# Patient Record
Sex: Male | Born: 1950 | Race: Black or African American | Hispanic: No | Smoking: Never smoker
Health system: Southern US, Community
[De-identification: ages and names within clinical notes are randomized; demographics above are authoritative.]

## PROBLEM LIST (undated history)

## (undated) DIAGNOSIS — C61 Malignant neoplasm of prostate: Secondary | ICD-10-CM

## (undated) DIAGNOSIS — Z803 Family history of malignant neoplasm of breast: Secondary | ICD-10-CM

## (undated) DIAGNOSIS — Z808 Family history of malignant neoplasm of other organs or systems: Secondary | ICD-10-CM

## (undated) DIAGNOSIS — Z8042 Family history of malignant neoplasm of prostate: Secondary | ICD-10-CM

## (undated) HISTORY — DX: Family history of malignant neoplasm of prostate: Z80.42

## (undated) HISTORY — DX: Malignant neoplasm of prostate: C61

## (undated) HISTORY — DX: Family history of malignant neoplasm of other organs or systems: Z80.8

## (undated) HISTORY — DX: Family history of malignant neoplasm of breast: Z80.3

## (undated) HISTORY — PX: HEMORROIDECTOMY: SUR656

## (undated) HISTORY — PX: ANAL FISSURE REPAIR: SHX2312

---

## 2019-07-03 ENCOUNTER — Inpatient Hospital Stay: Payer: Medicare HMO

## 2019-07-03 ENCOUNTER — Encounter (INDEPENDENT_AMBULATORY_CARE_PROVIDER_SITE_OTHER): Payer: Self-pay

## 2019-07-03 ENCOUNTER — Inpatient Hospital Stay: Payer: Medicare HMO | Attending: Oncology | Admitting: Oncology

## 2019-07-03 ENCOUNTER — Encounter: Payer: Self-pay | Admitting: Oncology

## 2019-07-03 ENCOUNTER — Other Ambulatory Visit: Payer: Self-pay

## 2019-07-03 VITALS — BP 145/89 | HR 90 | Temp 96.7°F | Resp 18 | Ht 74.61 in | Wt 167.8 lb

## 2019-07-03 DIAGNOSIS — E649 Sequelae of unspecified nutritional deficiency: Secondary | ICD-10-CM

## 2019-07-03 DIAGNOSIS — Z7952 Long term (current) use of systemic steroids: Secondary | ICD-10-CM | POA: Diagnosis not present

## 2019-07-03 DIAGNOSIS — M79606 Pain in leg, unspecified: Secondary | ICD-10-CM | POA: Insufficient documentation

## 2019-07-03 DIAGNOSIS — Z79818 Long term (current) use of other agents affecting estrogen receptors and estrogen levels: Secondary | ICD-10-CM | POA: Diagnosis not present

## 2019-07-03 DIAGNOSIS — C61 Malignant neoplasm of prostate: Secondary | ICD-10-CM

## 2019-07-03 DIAGNOSIS — F329 Major depressive disorder, single episode, unspecified: Secondary | ICD-10-CM | POA: Diagnosis not present

## 2019-07-03 DIAGNOSIS — E785 Hyperlipidemia, unspecified: Secondary | ICD-10-CM | POA: Diagnosis not present

## 2019-07-03 DIAGNOSIS — I2510 Atherosclerotic heart disease of native coronary artery without angina pectoris: Secondary | ICD-10-CM | POA: Diagnosis not present

## 2019-07-03 DIAGNOSIS — C7951 Secondary malignant neoplasm of bone: Secondary | ICD-10-CM | POA: Insufficient documentation

## 2019-07-03 DIAGNOSIS — G4730 Sleep apnea, unspecified: Secondary | ICD-10-CM | POA: Diagnosis not present

## 2019-07-03 DIAGNOSIS — R9721 Rising PSA following treatment for malignant neoplasm of prostate: Secondary | ICD-10-CM | POA: Insufficient documentation

## 2019-07-03 DIAGNOSIS — I129 Hypertensive chronic kidney disease with stage 1 through stage 4 chronic kidney disease, or unspecified chronic kidney disease: Secondary | ICD-10-CM | POA: Insufficient documentation

## 2019-07-03 DIAGNOSIS — Z452 Encounter for adjustment and management of vascular access device: Secondary | ICD-10-CM | POA: Insufficient documentation

## 2019-07-03 DIAGNOSIS — N183 Chronic kidney disease, stage 3 unspecified: Secondary | ICD-10-CM

## 2019-07-03 DIAGNOSIS — R740 Nonspecific elevation of levels of transaminase and lactic acid dehydrogenase [LDH]: Secondary | ICD-10-CM | POA: Diagnosis not present

## 2019-07-03 DIAGNOSIS — R3912 Poor urinary stream: Secondary | ICD-10-CM | POA: Diagnosis not present

## 2019-07-03 DIAGNOSIS — R748 Abnormal levels of other serum enzymes: Secondary | ICD-10-CM | POA: Diagnosis not present

## 2019-07-03 DIAGNOSIS — Z515 Encounter for palliative care: Secondary | ICD-10-CM | POA: Diagnosis not present

## 2019-07-03 DIAGNOSIS — Z9221 Personal history of antineoplastic chemotherapy: Secondary | ICD-10-CM | POA: Insufficient documentation

## 2019-07-03 DIAGNOSIS — Z79899 Other long term (current) drug therapy: Secondary | ICD-10-CM | POA: Diagnosis not present

## 2019-07-03 DIAGNOSIS — R5383 Other fatigue: Secondary | ICD-10-CM

## 2019-07-03 DIAGNOSIS — D649 Anemia, unspecified: Secondary | ICD-10-CM | POA: Diagnosis not present

## 2019-07-03 DIAGNOSIS — F32A Depression, unspecified: Secondary | ICD-10-CM

## 2019-07-03 DIAGNOSIS — M25512 Pain in left shoulder: Secondary | ICD-10-CM | POA: Insufficient documentation

## 2019-07-03 LAB — COMPREHENSIVE METABOLIC PANEL
ALT: 113 U/L — ABNORMAL HIGH (ref 0–44)
AST: 195 U/L — ABNORMAL HIGH (ref 15–41)
Albumin: 3.9 g/dL (ref 3.5–5.0)
Alkaline Phosphatase: 254 U/L — ABNORMAL HIGH (ref 38–126)
Anion gap: 8 (ref 5–15)
BUN: 37 mg/dL — ABNORMAL HIGH (ref 8–23)
CO2: 27 mmol/L (ref 22–32)
Calcium: 9.4 mg/dL (ref 8.9–10.3)
Chloride: 107 mmol/L (ref 98–111)
Creatinine, Ser: 1.38 mg/dL — ABNORMAL HIGH (ref 0.61–1.24)
GFR calc Af Amer: 60 mL/min — ABNORMAL LOW (ref >60)
GFR calc non Af Amer: 51 mL/min — ABNORMAL LOW (ref >60)
Glucose, Bld: 103 mg/dL — ABNORMAL HIGH (ref 70–99)
Potassium: 4.6 mmol/L (ref 3.5–5.1)
Sodium: 142 mmol/L (ref 135–145)
Total Bilirubin: 0.7 mg/dL (ref 0.3–1.2)
Total Protein: 6.9 g/dL (ref 6.5–8.1)

## 2019-07-03 LAB — CBC WITH DIFFERENTIAL/PLATELET
Abs Immature Granulocytes: 0.05 10*3/uL (ref 0.00–0.07)
Basophils Absolute: 0 10*3/uL (ref 0.0–0.1)
Basophils Relative: 0 %
Eosinophils Absolute: 0.1 10*3/uL (ref 0.0–0.5)
Eosinophils Relative: 1 %
HCT: 30.4 % — ABNORMAL LOW (ref 39.0–52.0)
Hemoglobin: 9.8 g/dL — ABNORMAL LOW (ref 13.0–17.0)
Immature Granulocytes: 1 %
Lymphocytes Relative: 32 %
Lymphs Abs: 2.3 10*3/uL (ref 0.7–4.0)
MCH: 30.9 pg (ref 26.0–34.0)
MCHC: 32.2 g/dL (ref 30.0–36.0)
MCV: 95.9 fL (ref 80.0–100.0)
Monocytes Absolute: 0.7 10*3/uL (ref 0.1–1.0)
Monocytes Relative: 9 %
Neutro Abs: 4 10*3/uL (ref 1.7–7.7)
Neutrophils Relative %: 57 %
Platelets: 251 10*3/uL (ref 150–400)
RBC: 3.17 MIL/uL — ABNORMAL LOW (ref 4.22–5.81)
RDW: 15 % (ref 11.5–15.5)
WBC: 7.1 10*3/uL (ref 4.0–10.5)
nRBC: 0.3 % — ABNORMAL HIGH (ref 0.0–0.2)

## 2019-07-03 LAB — PSA: Prostatic Specific Antigen: 937 ng/mL — ABNORMAL HIGH (ref 0.00–4.00)

## 2019-07-03 NOTE — Progress Notes (Signed)
Patient is recently moved from [place] and needs care with local oncologist.  He is unsure of the medication he is taking.

## 2019-07-03 NOTE — Progress Notes (Signed)
[place]  Reports that he was started on hormone blocker treatments-possibly Lupron and he did not tolerate that well.  Patient moved his care to [place] oncologist Patient moved to [facility] 3 weeks ago to live with his cousin.  His son helps with his moving. Patient reports having difficulty initiating a stream and urinary stream has been weak. His medication list shows that he is on Casodex 50 mg daily, Zytiga 250 mg 4 tablets by mouth dailyand prednisone.  Also on Flomax and doxazosin. Patient reports that he has not been taking any medication for the past few weeks.  Cannot recall details of the timeframe  He is in tears in the clinic.  Feels depressed.  No suicidal ideation. Denies any pain at this moment.  06/22/2019 Blood work doneWith primary care provider showed increased alkaline phosphatase 153, PSA at the level of 1004.   Review of Systems  Constitutional: Positive for fatigue. Negative for appetite change, chills, fever and unexpected weight change.  HENT:   Negative for hearing loss and voice change.   Eyes: Negative for eye problems and icterus.  Respiratory: Negative for chest tightness, cough and shortness of breath.   Cardiovascular: Negative for chest pain and leg swelling.  Gastrointestinal: Negative for abdominal distention and abdominal pain.  Endocrine: Negative for hot flashes.  Genitourinary: Negative for difficulty urinating, dysuria and frequency.        Weak urine stream  Musculoskeletal: Negative for arthralgias.  Skin: Negative for itching and rash.  Neurological: Negative for light-headedness and numbness.  Hematological: Negative for adenopathy. Does not bruise/bleed easily.  Psychiatric/Behavioral: Positive for depression. Negative for confusion. The patient is nervous/anxious.     MEDICAL HISTORY:  Past Medical History:  Diagnosis Date  . Prostate cancer [facility])     SURGICAL HISTORY: Past Surgical History:  Procedure Laterality Date   . ANAL FISSURE REPAIR    . HEMORROIDECTOMY      SOCIAL HISTORY: Social History   Socioeconomic History  . Marital status: Divorced    Spouse name: Not on file  . Number of children: Not on file  . Years of education: Not on file  . Highest education level: Not on file  Occupational History  . Not on file  Social Needs  . Financial resource strain: Not on file  . Food insecurity    Worry: Not on file    Inability: Not on file  . Transportation needs    Medical: Not on file    Non-medical: Not on file  Tobacco Use  . Smoking status: Never Smoker  . Smokeless tobacco: Never Used  Substance and Sexual Activity  . Alcohol use: Never    Frequency: Never  . Drug use: Never  . Sexual activity: Not Currently  Lifestyle  . Physical activity    Days per week: Not on file    Minutes per session: Not on file  . Stress: Not on file  Relationships  . Social Herbalist on phone: Not on file    Gets together: Not on file    Attends religious service: Not on file    Active member of club or organization: Not on file    Attends meetings of clubs or organizations: Not on file    Relationship status: Not on file  . Intimate partner violence    Fear of current or ex partner: Not on file    Emotionally abused: Not on file    Physically abused: Not on file  Forced sexual activity: Not on file  Other Topics Concern  . Not on file  Social History Narrative  . Not on file    FAMILY HISTORY: Family History  Problem Relation Age of Onset  . Skin cancer Father   . Breast cancer Sister   . Prostate cancer Paternal Grandfather     ALLERGIES:  is allergic to histamine.  MEDICATIONS:  Current Outpatient Medications  Medication Sig Dispense Refill  . doxazosin (CARDURA) 1 MG tablet Take 1 mg by mouth daily.    . tamsulosin (FLOMAX) 0.4 MG CAPS capsule Take 0.4 mg by mouth.     No current facility-administered medications for this visit.      PHYSICAL  EXAMINATION: ECOG PERFORMANCE STATUS: 1 - Symptomatic but completely ambulatory Vitals:   07/03/19 1113  BP: (!) 145/89  Pulse: 90  Resp: 18  Temp: (!) 96.7 F (35.9 C)   Filed Weights   07/03/19 1113  Weight: 167 lb 12.8 oz (76.1 kg)    Physical Exam Constitutional:      General: He is not in acute distress. HENT:     Head: Normocephalic and atraumatic.  Eyes:     General: No scleral icterus.    Pupils: Pupils are equal, round, and reactive to light.  Neck:     Musculoskeletal: Normal range of motion and neck supple.  Cardiovascular:     Rate and Rhythm: Normal rate and regular rhythm.     Heart sounds: Normal heart sounds.  Pulmonary:     Effort: Pulmonary effort is normal. No respiratory distress.     Breath sounds: No wheezing.  Abdominal:     General: Bowel sounds are normal. There is no distension.     Palpations: Abdomen is soft. There is no mass.     Tenderness: There is no abdominal tenderness.  Musculoskeletal: Normal range of motion.        General: No deformity.  Skin:    General: Skin is warm and dry.     Findings: No erythema or rash.  Neurological:     Mental Status: He is alert and oriented to person, place, and time.     Cranial Nerves: No cranial nerve deficit.     Coordination: Coordination normal.  Psychiatric:     Comments: Anxious and depressed.  Denies any suicidal ideation Tearful     LABORATORY DATA:  I have reviewed the data as listed Lab Results  Component Value Date   WBC 7.1 07/03/2019   HGB 9.8 (L) 07/03/2019   HCT 30.4 (L) 07/03/2019   MCV 95.9 07/03/2019   PLT 251 07/03/2019   Recent Labs    07/03/19 1202  NA 142  K 4.6  CL 107  CO2 27  GLUCOSE 103*  BUN 37*  CREATININE 1.38*  CALCIUM 9.4  GFRNONAA 51*  GFRAA 60*  PROT 6.9  ALBUMIN 3.9  AST 195*  ALT 113*  ALKPHOS 254*  BILITOT 0.7   Iron/TIBC/Ferritin/ %Sat No results found for: IRON, TIBC, FERRITIN, IRONPCTSAT    RADIOGRAPHIC STUDIES: I have  personally reviewed the radiological images as listed and agreed with the findings in the report. No results found.    ASSESSMENT & PLAN:  1. Prostate cancer ([place])   2. CKD (chronic kidney disease) stage 3, GFR 30-59 ml/min (HCC)   3. Normocytic anemia   4. Depression, unspecified depression type    Discussed with the patient that I would like to obtain his past oncology medical records. Check CBC,  CMP, PSA. He has not been on any of his medication recently. Further plan to be determined.  Likely need restaging of his pancreatic cancer.- CT and bone scan.  Patient mentioned that he has had CT scan done 2 months ago with his [place] oncologist.  Will obtain records and review. Labs reviewed. Normocytic anemia, hemoglobin 9.8, MCV 95.9. Likely chronic kidney disease with creatinine of 1.38, estimated EGFR 60.  Stage III PSA is elevated at 937  I discussed with patient about resuming Flomax for his urinary symptoms which most likely secondary to enlargement of prostate.  Patient was reluctant because of previously side effects when he was taking both Flomax and Cardura. He is also not interested to resume androgen deprivation therapy. Is tearful during current encounter.  Possible depression.  He denies any suicidal ideation. Not able to discuss further because of his mood.  Offered to call his family members for further discussion and patient declined. We will have him follow-up with [place] after we obtained medical records from his [place] oncologist. #Transaminitis, etiology unknown.  #I called patient on 07/04/2019 and had a phone discussion with him. He reports his mood is better now.   Lab results are reviewed and discussed with him. He has extremely elevated PSA, most likely secondary to being off medication and androgen deprivation therapy. I would obtain testosterone level, hepatitis panel, and also discussed about resuming androgen deprivation therapy with [facility] Koller.  Side effects  including vasomotor symptoms, i.e. hot flashes discussed with patient.  We also discussed about Effexor potentially being effective in treating vasomotor symptoms.  He voices understanding and is willing to proceed.  Orders Placed This Encounter  Procedures  . CBC with Differential/Platelet    Standing Status:   Future    Number of Occurrences:   1    Standing Expiration Date:   07/02/2020  . Comprehensive metabolic panel    Standing Status:   Future    Number of Occurrences:   1    Standing Expiration Date:   07/02/2020  . PSA    Standing Status:   Future    Number of Occurrences:   1    Standing Expiration Date:   07/02/2020    All questions were answered. The patient knows to call the clinic with any problems questions or concerns.  cc Latanya Presser B, MD   Return of visit: to be determined Thank you for this kind referral and the opportunity to participate in the care of this patient. A copy of today's note is routed to referring provider  Total face to face encounter time for this patient visit was 60 min. >50% of the time was  spent in counseling and coordination of care.    Earlie Server, MD, PhD Hematology Oncology [facility] at [facility] Pager- [phone] 07/03/2019

## 2019-07-04 ENCOUNTER — Encounter: Payer: Self-pay | Admitting: Oncology

## 2019-07-04 DIAGNOSIS — C61 Malignant neoplasm of prostate: Secondary | ICD-10-CM | POA: Insufficient documentation

## 2019-07-06 ENCOUNTER — Other Ambulatory Visit: Payer: Self-pay

## 2019-07-06 ENCOUNTER — Inpatient Hospital Stay: Payer: Medicare HMO

## 2019-07-06 DIAGNOSIS — C61 Malignant neoplasm of prostate: Secondary | ICD-10-CM | POA: Diagnosis not present

## 2019-07-06 DIAGNOSIS — D649 Anemia, unspecified: Secondary | ICD-10-CM

## 2019-07-06 DIAGNOSIS — F32A Depression, unspecified: Secondary | ICD-10-CM

## 2019-07-06 DIAGNOSIS — F329 Major depressive disorder, single episode, unspecified: Secondary | ICD-10-CM

## 2019-07-06 DIAGNOSIS — N183 Chronic kidney disease, stage 3 unspecified: Secondary | ICD-10-CM

## 2019-07-06 LAB — RETIC PANEL
Immature Retic Fract: 25.7 % — ABNORMAL HIGH (ref 2.3–15.9)
RBC.: 2.9 MIL/uL — ABNORMAL LOW (ref 4.22–5.81)
Retic Count, Absolute: 100.6 10*3/uL (ref 19.0–186.0)
Retic Ct Pct: 3.5 % — ABNORMAL HIGH (ref 0.4–3.1)
Reticulocyte Hemoglobin: 30.7 pg (ref >27.9)

## 2019-07-06 LAB — FERRITIN: Ferritin: 880 ng/mL — ABNORMAL HIGH (ref 24–336)

## 2019-07-06 LAB — IRON AND TIBC
Iron: 53 ug/dL (ref 45–182)
Saturation Ratios: 20 % (ref 17.9–39.5)
TIBC: 267 ug/dL (ref 250–450)
UIBC: 214 ug/dL

## 2019-07-06 LAB — FOLATE: Folate: 17.8 ng/mL (ref >5.9)

## 2019-07-06 LAB — VITAMIN B12: Vitamin B-12: 1240 pg/mL — ABNORMAL HIGH (ref 180–914)

## 2019-07-06 MED ORDER — DEGARELIX ACETATE 80 MG ~~LOC~~ SOLR: 80.0000 mg | Freq: Once | SUBCUTANEOUS | Status: DC

## 2019-07-06 MED ORDER — DEGARELIX ACETATE(240 MG DOSE) 120 MG/VIAL ~~LOC~~ SOLR
240.0000 mg | Freq: Once | SUBCUTANEOUS | Status: AC
  Administered 2019-07-06: 240 mg via SUBCUTANEOUS
  Filled 2019-07-06: qty 6

## 2019-07-07 LAB — HEPATITIS PANEL, ACUTE
HCV Ab: <0.1 s/co ratio (ref 0.0–0.9)
Hep A IgM: NEGATIVE
Hep B C IgM: NEGATIVE
Hepatitis B Surface Ag: NEGATIVE

## 2019-07-07 LAB — TESTOSTERONE: Testosterone: <3 ng/dL — ABNORMAL LOW (ref 264–916)

## 2019-07-12 ENCOUNTER — Inpatient Hospital Stay (HOSPITAL_BASED_OUTPATIENT_CLINIC_OR_DEPARTMENT_OTHER): Payer: Medicare HMO | Admitting: Hospice and Palliative Medicine

## 2019-07-12 ENCOUNTER — Other Ambulatory Visit: Payer: Self-pay

## 2019-07-12 VITALS — BP 146/102 | HR 79 | Temp 97.3°F | Resp 16 | Wt 171.9 lb

## 2019-07-12 DIAGNOSIS — F329 Major depressive disorder, single episode, unspecified: Secondary | ICD-10-CM | POA: Diagnosis not present

## 2019-07-12 DIAGNOSIS — C61 Malignant neoplasm of prostate: Secondary | ICD-10-CM

## 2019-07-12 DIAGNOSIS — Z7189 Other specified counseling: Secondary | ICD-10-CM

## 2019-07-12 DIAGNOSIS — Z515 Encounter for palliative care: Secondary | ICD-10-CM | POA: Diagnosis not present

## 2019-07-12 DIAGNOSIS — F32A Depression, unspecified: Secondary | ICD-10-CM

## 2019-07-12 MED ORDER — ESCITALOPRAM OXALATE 10 MG PO TABS: 10.0000 mg | ORAL_TABLET | Freq: Every day | ORAL | 0 refills | Status: DC

## 2019-07-12 NOTE — Progress Notes (Signed)
[facility]  Telephone:([phone] [phone]   Name: Tyler Stevenson Date: 07/12/2019 MRN: [id number]  DOB: 1951/04/07  Patient Care Team: Audley Hose, MD as PCP - General (Internal Medicine)    REASON FOR CONSULTATION: Palliative Care consult requested for this 68 y.o. 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  PMH also notable for sleep apnea, CAD, hypertension, hyperlipidemia and CKD stage III.  Patient moved here from [place] and presented to the cancer center to establish care. Patient was referred to palliative care to help discuss goals and manage ongoing symptoms.   SOCIAL HISTORY:     reports that he has never smoked. He has never used smokeless tobacco. He reports that he does not drink alcohol or use drugs.  Patient is divorced.  He is originally from [place] but lived in [place] and [place] and moved to [place] in 2018.  Patient moved to [facility] and August 2020.  Patient has a son in [place].  Patient also has another son and daughter in [place].  Patient is currently living with a cousin.  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  ADVANCE DIRECTIVES:  Does not have  CODE STATUS: DNR (MOST Form completed 07/12/2019)  PAST MEDICAL HISTORY: Past Medical History:  Diagnosis Date  . Prostate cancer ([place])     PAST SURGICAL HISTORY:  Past Surgical History:  Procedure Laterality Date  . ANAL FISSURE REPAIR    . HEMORROIDECTOMY      HEMATOLOGY/ONCOLOGY HISTORY:  Oncology History   No history exists.    ALLERGIES:  is allergic to histamine.  MEDICATIONS:  Current Outpatient Medications  Medication Sig Dispense Refill  . doxazosin (CARDURA) 1 MG tablet Take 1 mg by mouth daily.    . tamsulosin (FLOMAX) 0.4 MG CAPS  capsule Take 0.4 mg by mouth.     No current facility-administered medications for this visit.     VITAL SIGNS: There were no vitals taken for this visit. There were no vitals filed for this visit.  Estimated body mass index is 21.2 kg/m as calculated from the following:   Height as of 07/03/19: 6' 2.61" (1.895 m).   Weight as of 07/03/19: 167 lb 12.8 oz (76.1 kg).  LABS: CBC:    Component Value Date/Time   WBC 7.1 07/03/2019 1202   HGB 9.8 (L) 07/03/2019 1202   HCT 30.4 (L) 07/03/2019 1202   PLT 251 07/03/2019 1202   MCV 95.9 07/03/2019 1202   NEUTROABS 4.0 07/03/2019 1202   LYMPHSABS 2.3 07/03/2019 1202   MONOABS 0.7 07/03/2019 1202   EOSABS 0.1 07/03/2019 1202   BASOSABS 0.0 07/03/2019 1202   Comprehensive Metabolic Panel:    Component Value Date/Time   NA 142 07/03/2019 1202   K 4.6 07/03/2019 1202   CL 107 07/03/2019 1202   CO2 27 07/03/2019 1202   BUN 37 (H) 07/03/2019 1202   CREATININE 1.38 (H) 07/03/2019 1202   GLUCOSE 103 (H) 07/03/2019 1202   CALCIUM 9.4 07/03/2019 1202   AST 195 (H) 07/03/2019 1202   ALT 113 (H) 07/03/2019 1202   ALKPHOS 254 (H) 07/03/2019 1202   BILITOT 0.7 07/03/2019 1202   PROT 6.9 07/03/2019 1202   ALBUMIN 3.9 07/03/2019 1202    RADIOGRAPHIC STUDIES: No results found.  PERFORMANCE STATUS (ECOG) : 2 - Symptomatic, <  50% confined to bed  Review of Systems Unless otherwise noted, a complete review of systems is negative.  Physical Exam General: NAD, frail appearing, thin Cardiovascular: regular rate and rhythm Pulmonary: clear ant fields Abdomen: soft, nontender, + bowel sounds GU: no suprapubic tenderness Extremities: 1+ bilateral lower extremity edema, no joint deformities Skin: no rashes Neurological: Weakness but otherwise nonfocal  IMPRESSION: I met with patient today in the clinic.  Introduced palliative care services and attempted to establish therapeutic rapport.  Patient is not a great historian but I gather that he  was diagnosed many years back with prostate cancer in [place] and then transferred care to [place] in 2018.  Patient previously received radiation seeding and then had chemotherapy.  He was on hormonal therapy but describes a situation about 6 months ago where he received "a double dose" of something that landed him in the hospital.  He says that things have not been the same since that event.  Patient was living in [place] alone after his wife left him and they divorced.  He says that a friend was cooking for him and helping with his care.  She grew concerned that he was having increased confusion and called his son, which led to family bringing him to [place].  Patient is now living with a first cousin but says he is independent with his own care.  Patient feels like he is generally doing better.  We discussed his general goals.  Patient says that he knows he has stage IV cancer and has been told that it is not curable but does not believe that.  He feels that through a combination of strong faith and holistic nutrition, that a cure might be possible.  Patient does seem receptive to the idea of resuming treatment for his cancer if any options are available.  Patient was emotionally labile during the visit.  He became very tearful as he described feeling as if he has been betrayed by his ex-wife and family.  He describes feeling "emotional pain."  We discussed options for initiation of an antidepressant and patient was in agreement.  He was also interested in referral to counseling.  He adamantly denies SI/HI.  Patient does not have advance care directives.  He would be interested in establishing a healthcare power of attorney and living will and I reviewed those documents with him today.  He thinks he would want his cousin to be his decision maker as he lives locally.  We discussed CODE STATUS.  Patient was quick to tell me that he would not want his life prolonged artificially on machines.   He would not want to be resuscitated.  He was in agreement with DNR.   I completed a MOST form today. The patient outlined their wishes for the following treatment decisions:  Cardiopulmonary Resuscitation: Do Not Attempt Resuscitation (DNR/No CPR)  Medical Interventions: Limited Additional Interventions: Use medical treatment, IV fluids and cardiac monitoring as indicated, DO NOT USE intubation or mechanical ventilation. May consider use of less invasive airway support such as BiPAP or CPAP. Also provide comfort measures. Transfer to the hospital if indicated. Avoid intensive care.   Antibiotics: Antibiotics if indicated  IV Fluids: IV fluids if indicated  Feeding Tube: No feeding tube   Case and plan discussed with Dr. Tasia Catchings.  PLAN: -Patient to follow-up with Dr. Tasia Catchings regarding further work-up and treatment options -Would recommend CT of the head due to report of cognitive changes and emotional lability -We will start Lexapro  10 mg daily -MOST Form completed -DNR -ACP documents reviewed and sent home with patient -RTC in 2 weeks   Patient expressed understanding and was in agreement with this plan. He also understands that He can call the clinic at any time with any questions, concerns, or complaints.     Time Total: 60 minutes  Visit consisted of counseling and education dealing with the complex and emotionally intense issues of symptom management and palliative care in the setting of serious and potentially life-threatening illness.Greater than 50%  of this time was spent counseling and coordinating care related to the above assessment and plan.  Signed by: Altha Harm, PhD, NP-C [phone] (Work Cell)

## 2019-07-16 ENCOUNTER — Telehealth: Payer: Self-pay | Admitting: Pharmacy Technician

## 2019-07-16 ENCOUNTER — Encounter: Payer: Self-pay | Admitting: Oncology

## 2019-07-16 ENCOUNTER — Telehealth: Payer: Self-pay | Admitting: Pharmacist

## 2019-07-16 ENCOUNTER — Inpatient Hospital Stay: Payer: Medicare HMO | Admitting: Oncology

## 2019-07-16 ENCOUNTER — Other Ambulatory Visit: Payer: Self-pay

## 2019-07-16 ENCOUNTER — Inpatient Hospital Stay: Payer: Medicare HMO

## 2019-07-16 VITALS — BP 149/91 | HR 71 | Temp 98.2°F | Resp 18 | Wt 168.8 lb

## 2019-07-16 DIAGNOSIS — D649 Anemia, unspecified: Secondary | ICD-10-CM | POA: Diagnosis not present

## 2019-07-16 DIAGNOSIS — N183 Chronic kidney disease, stage 3 unspecified: Secondary | ICD-10-CM

## 2019-07-16 DIAGNOSIS — F32A Depression, unspecified: Secondary | ICD-10-CM

## 2019-07-16 DIAGNOSIS — C61 Malignant neoplasm of prostate: Secondary | ICD-10-CM | POA: Diagnosis not present

## 2019-07-16 DIAGNOSIS — Z95828 Presence of other vascular implants and grafts: Secondary | ICD-10-CM

## 2019-07-16 DIAGNOSIS — F329 Major depressive disorder, single episode, unspecified: Secondary | ICD-10-CM | POA: Diagnosis not present

## 2019-07-16 DIAGNOSIS — C7951 Secondary malignant neoplasm of bone: Secondary | ICD-10-CM

## 2019-07-16 DIAGNOSIS — N1831 Chronic kidney disease, stage 3a: Secondary | ICD-10-CM | POA: Insufficient documentation

## 2019-07-16 DIAGNOSIS — Z7189 Other specified counseling: Secondary | ICD-10-CM

## 2019-07-16 MED ORDER — ZYTIGA 500 MG PO TABS: 1000.0000 mg | ORAL_TABLET | Freq: Every day | ORAL | 0 refills | Status: DC

## 2019-07-16 MED ORDER — ACETAMINOPHEN-CODEINE #3 300-30 MG PO TABS: 1.0000 | ORAL_TABLET | Freq: Three times a day (TID) | ORAL | 0 refills | Status: DC | PRN

## 2019-07-16 MED ORDER — ABIRATERONE ACETATE 250 MG PO TABS: 750.0000 mg | ORAL_TABLET | Freq: Every day | ORAL | 0 refills | Status: DC

## 2019-07-16 MED ORDER — PREDNISONE 5 MG PO TABS: 5.0000 mg | ORAL_TABLET | Freq: Every day | ORAL | 0 refills | Status: DC

## 2019-07-16 MED ORDER — SODIUM CHLORIDE 0.9% FLUSH
10.0000 mL | Freq: Once | INTRAVENOUS | Status: AC
  Administered 2019-07-16: 10 mL via INTRAVENOUS
  Filled 2019-07-16: qty 10

## 2019-07-16 MED ORDER — HEPARIN SOD (PORK) LOCK FLUSH 100 UNIT/ML IV SOLN
500.0000 [IU] | Freq: Once | INTRAVENOUS | Status: AC
  Administered 2019-07-16: 10:00:00 500 [IU] via INTRAVENOUS

## 2019-07-16 NOTE — Telephone Encounter (Addendum)
Oral Oncology Patient Advocate Encounter  After completing a benefits investigation, prior authorization for Abiraterone (generic Zytiga) is not required at this time through [place].  Patient's copay is $0.00.   [facility] Phone [phone] Fax [phone] 07/16/2019 11:04 AM

## 2019-07-16 NOTE — Progress Notes (Signed)
Patient reports having pain in left leg, left arm, right side chest area pain last night.

## 2019-07-16 NOTE — Progress Notes (Signed)
Hematology Oncology Progress Note.   REASON FOR VISIT Follow up for treatment of metastatic prostate cancer.   PERTINENT ONCOLOGY HISTORY Tyler Stevenson is a 68 y.o.[place] who has above oncology history reviewed by me today presented for follow up visit for management of prostate cancer  Patient reports a history of prostate cancer. Initially diagnosed in [place]. He was treated at [facility].  Reports that he was started on hormone blocker treatments-possibly Lupron and he did not tolerate that well.  Patient moved his care to [place] oncologist Patient moved to [facility] 3 weeks ago to live with his cousin.  Patient reports having difficulty initiating a stream and urinary stream has been weak. His medication list shows that he is on Casodex 50 mg daily, Zytiga 250 mg 4 tablets by mouth dailyand prednisone.  Also on Flomax and doxazosin. Patient reports that he has not been taking any medication for the past few weeks.  Cannot recall details of the timeframe  He is in tears in the clinic.  Feels depressed.  No suicidal ideation. Denies any pain at this moment.  06/22/2019 Blood work doneWith primary care provider showed increased alkaline phosphatase 153, PSA at the level of 1004.   INTERVAL HISTORY Tyler Stevenson is a 68 y.o. male who has above history reviewed by me today presents for follow up visit for management of metastatic prostate cancer Problems and complaints are listed below: I obtained patient's medical records from [place]. Extensive medical record review was performed by me. Per [place] oncologist notes, patient was started on Zytiga 1000 mg daily along with prednisone 5 mg twice daily in March 2020.  02/27/2019 PSA was 150. 02/2019, PSA rises to 324 and decision was made to start Taxotere treatments. Port was placed in June 2020. It was not clear how many cycles of Taxotere that patient received. 04/24/2019 patient was responding to  Taxotere with significant drop of the PSA to 128 patient experienced effects from Taxotere including fatigue, mild constipation.  05/17/2019 patient missed his chemo on 05/03/2019 since he was in [place]. 05/30/2019 he probably get another Taxotere at that time.  Per note patient had MRI brain 05/23/2019 which showed no acute intracranial process.  Mild generalized volume loss.  Mild to moderate sequela of chronic small vessel ischemic disease.  No evidence of intracranial metastatic disease.  Today patient reports that lower extremity pain, left shoulder pain which he attributes to pulling a muscle.  Denies any back pain Patient has establish care with palliative service.  Started on Lexapro 10 mg daily.  Reports tolerating well. He takes ibuprofen for his pain.  Review of Systems  Constitutional: Positive for fatigue. Negative for appetite change, chills, fever and unexpected weight change.  HENT:   Negative for hearing loss and voice change.   Eyes: Negative for eye problems and icterus.  Respiratory: Negative for chest tightness, cough and shortness of breath.   Cardiovascular: Negative for chest pain and leg swelling.  Gastrointestinal: Negative for abdominal distention and abdominal pain.  Endocrine: Negative for hot flashes.  Genitourinary: Negative for difficulty urinating, dysuria and frequency.        Weak urine stream  Musculoskeletal: Negative for arthralgias.       Leg and arm pain.  Skin: Negative for itching and rash.  Neurological: Negative for light-headedness and numbness.  Hematological: Negative for adenopathy. Does not bruise/bleed easily.  Psychiatric/Behavioral: Positive for depression. Negative for confusion. The patient is nervous/anxious.     MEDICAL  HISTORY:  Past Medical History:  Diagnosis Date  . Prostate cancer [facility])     SURGICAL HISTORY: Past Surgical History:  Procedure Laterality Date  . ANAL FISSURE REPAIR    . HEMORROIDECTOMY      SOCIAL  HISTORY: Social History   Socioeconomic History  . Marital status: Divorced    Spouse name: Not on file  . Number of children: Not on file  . Years of education: Not on file  . Highest education level: Not on file  Occupational History  . Not on file  Social Needs  . Financial resource strain: Not on file  . Food insecurity    Worry: Not on file    Inability: Not on file  . Transportation needs    Medical: Not on file    Non-medical: Not on file  Tobacco Use  . Smoking status: Never Smoker  . Smokeless tobacco: Never Used  Substance and Sexual Activity  . Alcohol use: Never    Frequency: Never  . Drug use: Never  . Sexual activity: Not Currently  Lifestyle  . Physical activity    Days per week: Not on file    Minutes per session: Not on file  . Stress: Not on file  Relationships  . Social Herbalist on phone: Not on file    Gets together: Not on file    Attends religious service: Not on file    Active member of club or organization: Not on file    Attends meetings of clubs or organizations: Not on file    Relationship status: Not on file  . Intimate partner violence    Fear of current or ex partner: Not on file    Emotionally abused: Not on file    Physically abused: Not on file    Forced sexual activity: Not on file  Other Topics Concern  . Not on file  Social History Narrative  . Not on file    FAMILY HISTORY: Family History  Problem Relation Age of Onset  . Skin cancer Father   . Breast cancer Sister   . Prostate cancer Paternal Grandfather     ALLERGIES:  is allergic to histamine.  MEDICATIONS:  Current Outpatient Medications  Medication Sig Dispense Refill  . escitalopram (LEXAPRO) 10 MG tablet Take 1 tablet (10 mg total) by mouth daily. 30 tablet 0  . ibuprofen (ADVIL) 600 MG tablet Take 600 mg by mouth 2 (two) times daily as needed.    . Magnesium 300 MG CAPS Take by mouth 2 (two) times daily.    Marland Kitchen abiraterone acetate (ZYTIGA) 500 MG  tablet Take 2 tablets (1,000 mg total) by mouth daily. Take on an empty stomach 1 hour before or 2 hours after a meal. 30 tablet 0  . doxazosin (CARDURA) 1 MG tablet Take 1 mg by mouth daily.    . predniSONE (DELTASONE) 5 MG tablet Take 1 tablet (5 mg total) by mouth daily with breakfast. 60 tablet 0  . tamsulosin (FLOMAX) 0.4 MG CAPS capsule Take 0.4 mg by mouth.     No current facility-administered medications for this visit.      PHYSICAL EXAMINATION: ECOG PERFORMANCE STATUS: 1 - Symptomatic but completely ambulatory Vitals:   07/16/19 0848  BP: (!) 149/91  Pulse: 71  Resp: 18  Temp: 98.2 F (36.8 C)   Filed Weights   07/16/19 0848  Weight: 168 lb 12.8 oz (76.6 kg)    Physical Exam Constitutional:  General: He is not in acute distress. HENT:     Head: Normocephalic and atraumatic.  Eyes:     General: No scleral icterus.    Pupils: Pupils are equal, round, and reactive to light.  Neck:     Musculoskeletal: Normal range of motion and neck supple.  Cardiovascular:     Rate and Rhythm: Normal rate and regular rhythm.     Heart sounds: Normal heart sounds.  Pulmonary:     Effort: Pulmonary effort is normal. No respiratory distress.     Breath sounds: No wheezing.  Abdominal:     General: Bowel sounds are normal. There is no distension.     Palpations: Abdomen is soft. There is no mass.     Tenderness: There is no abdominal tenderness.  Musculoskeletal: Normal range of motion.        General: No deformity.  Skin:    General: Skin is warm and dry.     Findings: No erythema or rash.  Neurological:     Mental Status: He is alert and oriented to person, place, and time.     Cranial Nerves: No cranial nerve deficit.     Coordination: Coordination normal.  Psychiatric:     Comments: Anxious and depressed.  Denies any suicidal ideation Tearful     LABORATORY DATA:  I have reviewed the data as listed Lab Results  Component Value Date   WBC 7.1 07/03/2019   HGB  9.8 (L) 07/03/2019   HCT 30.4 (L) 07/03/2019   MCV 95.9 07/03/2019   PLT 251 07/03/2019   Recent Labs    07/03/19 1202  NA 142  K 4.6  CL 107  CO2 27  GLUCOSE 103*  BUN 37*  CREATININE 1.38*  CALCIUM 9.4  GFRNONAA 51*  GFRAA 60*  PROT 6.9  ALBUMIN 3.9  AST 195*  ALT 113*  ALKPHOS 254*  BILITOT 0.7   Iron/TIBC/Ferritin/ %Sat    Component Value Date/Time   IRON 53 07/06/2019 0958   TIBC 267 07/06/2019 0958   FERRITIN 880 (H) 07/06/2019 0958   IRONPCTSAT 20 07/06/2019 0958      RADIOGRAPHIC STUDIES: I have personally reviewed the radiological images as listed and agreed with the findings in the report. No results found.    ASSESSMENT & PLAN:  1. Prostate cancer metastatic to bone ([place])   2. Depression, unspecified depression type   3. CKD (chronic kidney disease) stage 3, GFR 30-59 ml/min (HCC)   4. Normocytic anemia   5. Goals of care, counseling/discussion    #Labs are discussed with patient.  Patient has extremely high PSA level, with testosterone level less than 3.  Consistent with castration resistant prostate cancer.  Recommend patient to proceed with CT chest abdomen pelvis without contrast, bone scan for further evaluation of the extent of the cancer. Meanwhile patient has been restarted on ADT with Firmagon injections. I discussed with patient about restarting Zytiga 1000 mg daily prednisone 5 mg twice daily.  He agrees. We will ask oral chemotherapy pharmacist to look into his coverage is.  Prednisone prescription was given to patient.  Neoplasm related pain, patient is currently taking ibuprofen for pain.  He has CKD with a creatinine of 1.38. Recommend patient to avoid NSAIDs which is a nephrotoxins. Recommend patient to start with Tylenol #3, will switch to stronger narcotics if Tylenol 3 is not to control his pain. Patient agrees with above  Goal of care, discussed with patient that he has stage IV prostate cancer  and it is not curable.  I am  still waiting for the clarification from his [place] oncologist office to regarding how many doses of Taxotere that he got. He may need to be restarted on Taxotere if PSA is resistant to Zytiga and prednisone. Patient has a Mediport placed in June 2020.  We will schedule patient to have a port flush for the time being.  #Transaminitis, etiology unknown.  Pending above work-up.   Orders Placed This Encounter  Procedures  . NM Bone Scan Whole Body    Standing Status:   Future    Standing Expiration Date:   07/15/2020    Order Specific Question:   If indicated for the ordered procedure, I authorize the administration of a radiopharmaceutical per Radiology protocol    Answer:   Yes    Order Specific Question:   Preferred imaging location?    Answer:   [facility]    Order Specific Question:   Radiology Contrast Protocol - do NOT remove file path    Answer:   \\[email or web address]\[email or web address]  . CT Chest Wo Contrast    Standing Status:   Future    Standing Expiration Date:   07/15/2020    Order Specific Question:   Preferred imaging location?    Answer:   [facility]    Order Specific Question:   Radiology Contrast Protocol - do NOT remove file path    Answer:   \\[email or web address]\Radiant\CTProtocols.pdf  . CT Abdomen Pelvis Wo Contrast    Standing Status:   Future    Standing Expiration Date:   07/15/2020    Order Specific Question:   Preferred imaging location?    Answer:   [facility]    Order Specific Question:   Is Oral Contrast requested for this exam?    Answer:   Yes, Per Radiology protocol    Order Specific Question:   Radiology Contrast Protocol - do NOT remove file path    Answer:   \\[email or web address]\Radiant\CTProtocols.pdf    All questions were answered. The patient knows to call the clinic with any problems questions or concerns.  cc Latanya Presser B, MD   Return of visit: 2 weeks Thank you for this kind referral and the opportunity to  participate in the care of this patient. A copy of today's note is routed to referring provider  Total face to face encounter time for this patient visit was 40 min. >50% of the time was  spent in counseling and coordination of care.    Earlie Server, MD, PhD Hematology Oncology [facility] at [facility] Pager- [phone] 07/16/2019

## 2019-07-16 NOTE — Telephone Encounter (Signed)
Oral Oncology Pharmacist Encounter  Received new prescription for Zytiga (abiraterone) for the treatment of metastatic castration resistant prostate cancer in conjunction with prednisone and firmagon, planned duration until disease progression or unacceptable drug toxicity. He has been on abiraterone in the past prescribed by another provider. The plan is to get him reestablished on abiraterone.  CMP from 07/03/2019 assessed, elevation in AST/ALT likely due to disease burden. Patient had labs on 05/25/2019 (scanned in Media) that has AST/ALT within normal limits when he was being treated. Spoke with Dr. Tasia Catchings, patient is having imaging repeated to confirm liver labs are due to disease. For now Dr. Tasia Catchings would like for Tyler Stevenson to start on a reduced dose. Prescription dose and frequency assessed.   Current medication list in Epic reviewed, one relevant DDIs with abiraterone identified: -Tamsulosin: Abiraterone may increase the serum concentration of tamsulosin. Monitor for increased tamsulosin effects. No baseline dose adjustment needed.    Prescription has been e-scribed to the [facility] for benefits analysis and approval.  Oral Oncology Clinic will continue to follow for insurance authorization, copayment issues, initial counseling and start date.  Darl Pikes, PharmD, BCPS, [facility] Hematology/Oncology Clinical Pharmacist [facility] [phone]  07/16/2019 10:53 AM

## 2019-07-18 MED FILL — ABIRATERONE ACETATE 250 MG: 250 | 30 days supply | Qty: 90 | Fill #0

## 2019-07-18 NOTE — Telephone Encounter (Signed)
Oral Chemotherapy Pharmacist Encounter  Patient Education I spoke with patient for overview of new oral chemotherapy medication: Zytiga (abiraterone) for the treatment of metastatic castration resistant prostate cancer in conjunction with prednisone and firmagon, planned duration until disease progression or unacceptable drug toxicity. He has been on abiraterone in the past prescribed by another provider. The plan is to get him reestablished on abiraterone.    Counseled patient on administration, dosing, side effects, monitoring, drug-food interactions, safe handling, storage, and disposal. Patient will take 3 tablets (750 mg total) by mouth daily. Take on an empty stomach 1 hour before or 2 hours after a meal.  Side effects include but not limited to: HTN, fatigue, decreased wbc.    Reviewed with patient importance of keeping a medication schedule and plan for any missed doses.  Mr. Egler voiced understanding and appreciation. All questions answered. Medication handout placed in the mail.  Provided patient with [facility] phone number. Patient knows to call the office with questions or concerns. Oral Chemotherapy Navigation Clinic will continue to follow.  Darl Pikes, PharmD, BCPS, [facility] Hematology/Oncology Clinical Pharmacist [facility] Oral [place] [facility] [phone]  07/18/2019 2:13 PM

## 2019-07-18 NOTE — Telephone Encounter (Signed)
Scheduled delivery of Zytiga for 9/24 from [facility].

## 2019-07-20 ENCOUNTER — Telehealth: Payer: Self-pay | Admitting: *Deleted

## 2019-07-20 NOTE — Telephone Encounter (Signed)
Patient called and left a message with the triage nurse today that medication was not delivered yesterday.  I checked the UPS log and the package is out for delivery today.  I called the patient to let him know it should be delivered today and apologized for the inconvenience.  He was very understanding and was just eager to get started on his medication.  I will be checking the UPS log throughout the day to check his delivery status.

## 2019-07-20 NOTE — Telephone Encounter (Signed)
Patient called reporting that medicine did not come in as promised and just want ed to make [place] aware

## 2019-07-20 NOTE — Telephone Encounter (Signed)
Spoke to patient.  Checked UPS tracker and package is out for delivery today.  I have informed the patient of this and apologized it was not delivered when promised.

## 2019-07-23 NOTE — Telephone Encounter (Signed)
Package delivered 9/25 at 5:55pm.

## 2019-07-24 ENCOUNTER — Telehealth: Payer: Self-pay

## 2019-07-24 NOTE — Telephone Encounter (Signed)
Left message on patient VM informing him that CT scans scheduled for 07/25/19 have been cancelled due to [facility] listed as out-of-network on pt insurance.  The [place] insurance authorization dept is working on getting eligibility for [facility].  It is OK to keep the bone scan scheduled for tomorrow.

## 2019-07-25 ENCOUNTER — Ambulatory Visit: Payer: Medicare HMO

## 2019-07-25 ENCOUNTER — Other Ambulatory Visit: Payer: Self-pay

## 2019-07-25 ENCOUNTER — Ambulatory Visit
Admission: RE | Admit: 2019-07-25 | Discharge: 2019-07-25 | Disposition: A | Discharge status: Home / Self Care | Payer: Medicare HMO | Source: Ambulatory Visit | Attending: Oncology | Admitting: Oncology

## 2019-07-25 ENCOUNTER — Encounter
Admission: RE | Admit: 2019-07-25 | Discharge: 2019-07-25 | Disposition: A | Discharge status: Home / Self Care | Payer: Medicare HMO | Source: Ambulatory Visit | Attending: Oncology | Admitting: Oncology

## 2019-07-25 DIAGNOSIS — C61 Malignant neoplasm of prostate: Secondary | ICD-10-CM | POA: Diagnosis not present

## 2019-07-25 DIAGNOSIS — C7951 Secondary malignant neoplasm of bone: Secondary | ICD-10-CM | POA: Insufficient documentation

## 2019-07-25 MED ORDER — TECHNETIUM TC 99M MEDRONATE IV KIT
20.0000 | PACK | Freq: Once | INTRAVENOUS | Status: AC | PRN
  Administered 2019-07-25: 22.748 via INTRAVENOUS

## 2019-07-26 ENCOUNTER — Encounter: Payer: Medicare HMO | Admitting: Hospice and Palliative Medicine

## 2019-07-30 ENCOUNTER — Inpatient Hospital Stay: Payer: Medicare HMO | Admitting: Hospice and Palliative Medicine

## 2019-07-30 ENCOUNTER — Inpatient Hospital Stay: Payer: Medicare HMO | Admitting: Oncology

## 2019-08-03 ENCOUNTER — Ambulatory Visit
Admission: RE | Admit: 2019-08-03 | Discharge: 2019-08-03 | Disposition: A | Discharge status: Home / Self Care | Payer: Medicare HMO | Source: Ambulatory Visit | Attending: Oncology | Admitting: Oncology

## 2019-08-03 ENCOUNTER — Other Ambulatory Visit: Payer: Self-pay

## 2019-08-03 ENCOUNTER — Encounter: Payer: Self-pay | Admitting: Oncology

## 2019-08-03 DIAGNOSIS — C7951 Secondary malignant neoplasm of bone: Secondary | ICD-10-CM | POA: Diagnosis present

## 2019-08-03 DIAGNOSIS — C61 Malignant neoplasm of prostate: Secondary | ICD-10-CM | POA: Diagnosis not present

## 2019-08-03 NOTE — Progress Notes (Signed)
Called patient to prechart for office visit. Patient c/o hot flashes & urinary frequency at night.  Patient request refill of tylenol.

## 2019-08-06 ENCOUNTER — Other Ambulatory Visit: Payer: Self-pay

## 2019-08-06 ENCOUNTER — Inpatient Hospital Stay (HOSPITAL_BASED_OUTPATIENT_CLINIC_OR_DEPARTMENT_OTHER): Payer: Medicare HMO | Admitting: Hospice and Palliative Medicine

## 2019-08-06 ENCOUNTER — Inpatient Hospital Stay: Payer: Medicare HMO | Attending: Oncology | Admitting: Oncology

## 2019-08-06 VITALS — BP 164/94 | HR 76 | Temp 97.1°F | Resp 18 | Wt 174.9 lb

## 2019-08-06 DIAGNOSIS — R5383 Other fatigue: Secondary | ICD-10-CM | POA: Insufficient documentation

## 2019-08-06 DIAGNOSIS — Z79818 Long term (current) use of other agents affecting estrogen receptors and estrogen levels: Secondary | ICD-10-CM | POA: Insufficient documentation

## 2019-08-06 DIAGNOSIS — F329 Major depressive disorder, single episode, unspecified: Secondary | ICD-10-CM | POA: Diagnosis not present

## 2019-08-06 DIAGNOSIS — E785 Hyperlipidemia, unspecified: Secondary | ICD-10-CM | POA: Diagnosis not present

## 2019-08-06 DIAGNOSIS — G4730 Sleep apnea, unspecified: Secondary | ICD-10-CM | POA: Insufficient documentation

## 2019-08-06 DIAGNOSIS — C7951 Secondary malignant neoplasm of bone: Secondary | ICD-10-CM | POA: Diagnosis not present

## 2019-08-06 DIAGNOSIS — R3912 Poor urinary stream: Secondary | ICD-10-CM | POA: Diagnosis not present

## 2019-08-06 DIAGNOSIS — Z95828 Presence of other vascular implants and grafts: Secondary | ICD-10-CM | POA: Diagnosis not present

## 2019-08-06 DIAGNOSIS — Z7952 Long term (current) use of systemic steroids: Secondary | ICD-10-CM | POA: Diagnosis not present

## 2019-08-06 DIAGNOSIS — Z515 Encounter for palliative care: Secondary | ICD-10-CM

## 2019-08-06 DIAGNOSIS — R531 Weakness: Secondary | ICD-10-CM | POA: Insufficient documentation

## 2019-08-06 DIAGNOSIS — I2510 Atherosclerotic heart disease of native coronary artery without angina pectoris: Secondary | ICD-10-CM | POA: Insufficient documentation

## 2019-08-06 DIAGNOSIS — C61 Malignant neoplasm of prostate: Secondary | ICD-10-CM | POA: Insufficient documentation

## 2019-08-06 DIAGNOSIS — F32A Depression, unspecified: Secondary | ICD-10-CM

## 2019-08-06 DIAGNOSIS — Z8042 Family history of malignant neoplasm of prostate: Secondary | ICD-10-CM | POA: Insufficient documentation

## 2019-08-06 DIAGNOSIS — Z7189 Other specified counseling: Secondary | ICD-10-CM

## 2019-08-06 DIAGNOSIS — N189 Chronic kidney disease, unspecified: Secondary | ICD-10-CM | POA: Insufficient documentation

## 2019-08-06 DIAGNOSIS — I129 Hypertensive chronic kidney disease with stage 1 through stage 4 chronic kidney disease, or unspecified chronic kidney disease: Secondary | ICD-10-CM | POA: Diagnosis not present

## 2019-08-06 DIAGNOSIS — R748 Abnormal levels of other serum enzymes: Secondary | ICD-10-CM | POA: Insufficient documentation

## 2019-08-06 DIAGNOSIS — R7401 Elevation of levels of liver transaminase levels: Secondary | ICD-10-CM | POA: Diagnosis not present

## 2019-08-06 DIAGNOSIS — Z9221 Personal history of antineoplastic chemotherapy: Secondary | ICD-10-CM | POA: Diagnosis not present

## 2019-08-06 DIAGNOSIS — R600 Localized edema: Secondary | ICD-10-CM | POA: Diagnosis not present

## 2019-08-06 DIAGNOSIS — Z79899 Other long term (current) drug therapy: Secondary | ICD-10-CM | POA: Diagnosis not present

## 2019-08-06 MED ORDER — ACETAMINOPHEN-CODEINE #3 300-30 MG PO TABS: 1.0000 | ORAL_TABLET | Freq: Two times a day (BID) | ORAL | 0 refills | Status: DC | PRN

## 2019-08-06 MED ORDER — DOXAZOSIN MESYLATE 1 MG PO TABS: 1.0000 mg | ORAL_TABLET | Freq: Every day | ORAL | 3 refills | Status: DC

## 2019-08-06 MED ORDER — ACETAMINOPHEN-CODEINE #3 300-30 MG PO TABS: 1.0000 | ORAL_TABLET | Freq: Three times a day (TID) | ORAL | 0 refills | Status: DC | PRN

## 2019-08-06 MED ORDER — ACETAMINOPHEN-CODEINE #3 300-30 MG PO TABS: 1.0000 | ORAL_TABLET | Freq: Two times a day (BID) | ORAL | 0 refills | Status: AC | PRN

## 2019-08-06 NOTE — Progress Notes (Signed)
[facility]  Telephone:([phone] [phone]   Name: Tyler Stevenson Date: 08/06/2019 MRN: [id number]  DOB: 07/23/1951  Patient Care Team: Audley Hose, MD as PCP - General (Internal Medicine)    REASON FOR CONSULTATION: Palliative Care consult requested for this 68 y.o. 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  PMH also notable for sleep apnea, CAD, hypertension, hyperlipidemia and CKD stage III.  Patient moved here from [place] and presented to the cancer center to establish care. Patient was referred to palliative care to help discuss goals and manage ongoing symptoms.   SOCIAL HISTORY:     reports that he has never smoked. He has never used smokeless tobacco. He reports that he does not drink alcohol or use drugs.  Patient is divorced.  He is originally from [place] but lived in [place] and [place] and moved to [place] in 2018.  Patient moved to [facility] and August 2020.  Patient has a son in [place].  Patient also has another son and daughter in [place].  Patient is currently living with a cousin.  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  ADVANCE DIRECTIVES:  Does not have  CODE STATUS: DNR (MOST Form completed 07/12/2019)  PAST MEDICAL HISTORY: Past Medical History:  Diagnosis Date   Prostate cancer ([place])     PAST SURGICAL HISTORY:  Past Surgical History:  Procedure Laterality Date   ANAL FISSURE REPAIR     HEMORROIDECTOMY      HEMATOLOGY/ONCOLOGY HISTORY:  Oncology History   No history exists.    ALLERGIES:  is allergic to histamine.  MEDICATIONS:  Current Outpatient Medications  Medication Sig Dispense Refill   abiraterone acetate (ZYTIGA) 250 MG tablet Take 3 tablets (750 mg total) by mouth daily. Take on  an empty stomach 1 hour before or 2 hours after a meal 90 tablet 0   acetaminophen (TYLENOL 8 HOUR) 650 MG CR tablet Take 650 mg by mouth every 8 (eight) hours as needed for pain.     acetaminophen-codeine (TYLENOL #3) 300-30 MG tablet Take 1 tablet by mouth every 8 (eight) hours as needed for moderate pain or severe pain. 30 tablet 0   Biotin 1000 MCG tablet Take 1,000 mcg by mouth 2 (two) times daily.     doxazosin (CARDURA) 1 MG tablet Take 1 tablet (1 mg total) by mouth daily. 30 tablet 3   ergocalciferol (VITAMIN D2) 1.25 MG (50000 UT) capsule Take 5,000 Units by mouth 2 (two) times daily.     escitalopram (LEXAPRO) 10 MG tablet Take 1 tablet (10 mg total) by mouth daily. 30 tablet 0   Magnesium 300 MG CAPS Take by mouth 2 (two) times daily.     predniSONE (DELTASONE) 5 MG tablet Take 1 tablet (5 mg total) by mouth daily with breakfast. 60 tablet 0   No current facility-administered medications for this visit.     VITAL SIGNS: There were no vitals taken for this visit. There were no vitals filed for this visit.  Estimated body mass index is 22.09 kg/m as calculated from the following:   Height as of 07/03/19: 6' 2.61" (1.895 m).   Weight as of an earlier encounter on 08/06/19: 174 lb 14.4 oz (79.3 kg).  LABS: CBC:    Component Value Date/Time   WBC 7.1 07/03/2019 1202  HGB 9.8 (L) 07/03/2019 1202   HCT 30.4 (L) 07/03/2019 1202   PLT 251 07/03/2019 1202   MCV 95.9 07/03/2019 1202   NEUTROABS 4.0 07/03/2019 1202   LYMPHSABS 2.3 07/03/2019 1202   MONOABS 0.7 07/03/2019 1202   EOSABS 0.1 07/03/2019 1202   BASOSABS 0.0 07/03/2019 1202   Comprehensive Metabolic Panel:    Component Value Date/Time   NA 142 07/03/2019 1202   K 4.6 07/03/2019 1202   CL 107 07/03/2019 1202   CO2 27 07/03/2019 1202   BUN 37 (H) 07/03/2019 1202   CREATININE 1.38 (H) 07/03/2019 1202   GLUCOSE 103 (H) 07/03/2019 1202   CALCIUM 9.4 07/03/2019 1202   AST 195 (H) 07/03/2019 1202   ALT 113  (H) 07/03/2019 1202   ALKPHOS 254 (H) 07/03/2019 1202   BILITOT 0.7 07/03/2019 1202   PROT 6.9 07/03/2019 1202   ALBUMIN 3.9 07/03/2019 1202    RADIOGRAPHIC STUDIES: Ct Abdomen Pelvis Wo Contrast  Result Date: 08/03/2019 CLINICAL DATA:  Metastatic prostate cancer. Restaging. Post systemic therapy. EXAM: CT ABDOMEN AND PELVIS WITHOUT CONTRAST TECHNIQUE: Multidetector CT imaging of the abdomen and pelvis was performed following the standard protocol without IV contrast. COMPARISON:  07/25/2019 bone scan FINDINGS: Lower chest: Mild patchy ground-glass opacity at both lung bases. Superior approach central venous catheter terminates in the right atrium. Coronary atherosclerosis. Hepatobiliary: Normal liver size. No liver mass. Cholelithiasis. No biliary ductal dilatation. Pancreas: Normal, with no mass or duct dilation. Spleen: Normal size. No mass. Adrenals/Urinary Tract: Normal adrenals. No renal stones. No hydronephrosis. Exophytic simple 4.1 cm lower right renal cyst. Simple 2.4 cm upper left renal cyst. Subcentimeter hypodense exophytic renal cortical lesion in the posterior upper left kidney, too small to characterize. No additional contour deforming renal lesions. Moderate diffuse bladder wall thickening. No significant bladder distention. Stomach/Bowel: Normal non-distended stomach. Normal caliber small bowel with no small bowel wall thickening. Normal appendix. Oral contrast transits to the right colon. Mild sigmoid diverticulosis, with no large bowel wall thickening or significant pericolonic fat stranding. Moderate colonic stool volume. Vascular/Lymphatic: Atherosclerotic nonaneurysmal abdominal aorta. No pathologically enlarged lymph nodes in the abdomen or pelvis. Reproductive: Mildly enlarged prostate with nonspecific internal prostatic calcifications. Other: No pneumoperitoneum, ascites or focal fluid collection. Musculoskeletal: Dense patchy confluent sclerosis throughout visualized axial and  proximal appendicular skeleton. Mild thoracolumbar spondylosis. IMPRESSION: 1. Widespread confluent sclerotic osseous metastatic disease in the visualized axial and proximal appendicular skeleton. 2. No lymphadenopathy or other soft tissue evidence of metastatic disease. 3. Nonspecific mild patchy ground-glass opacity at both lung bases, presumably inflammatory. 4. Nonspecific moderate diffuse bladder wall thickening, potentially due to chronic bladder outlet obstruction by the mildly enlarged prostate. Correlate with urinalysis as clinically warranted. 5.  Aortic Atherosclerosis (ICD10-I70.0). Electronically Signed   By: Ilona Sorrel M.D.   On: 08/03/2019 10:20   Nm Bone Scan Whole Body  Result Date: 07/26/2019 CLINICAL DATA:  Castration resistant metastatic prostate cancer EXAM: NUCLEAR MEDICINE WHOLE BODY BONE SCAN TECHNIQUE: Whole body anterior and posterior images were obtained approximately 3 hours after intravenous injection of radiopharmaceutical. RADIOPHARMACEUTICALS:  22.748 mCi Technetium-41m MDP IV COMPARISON:  None. FINDINGS: Widespread abnormal uptake of tracer throughout the axial and appendicular skeleton consistent with extensive osseous metastatic disease. This includes skull, medial clavicles, spine, ribs, pelvis, scapula, femora, humeri, proximal radii, and likely proximal RIGHT tibia. Paucity of urinary tract and soft tissue distribution of tracer, with small amount of excreted tracer within urinary bladder suggesting super scan appearance. IMPRESSION: Widespread osseous  metastatic disease. Electronically Signed   By: Lavonia Dana M.D.   On: 07/26/2019 08:35    PERFORMANCE STATUS (ECOG) : 2 - Symptomatic, <50% confined to bed  Review of Systems Unless otherwise noted, a complete review of systems is negative.  Physical Exam General: NAD, frail appearing, thin Pulmonary: Unlabored Extremities: 1+ bilateral lower extremity edema, no joint deformities Skin: no rashes Neurological:  Weakness but otherwise nonfocal  IMPRESSION: Routine follow-up visit made today.  Patient reports that he is doing reasonably well.  He denies any acute changes or concerns today.  He reports overall feeling improved.  He says that his appetite is "too good."  We discussed his emotional coping.  He demonstrated less emotional lability during today's visit.  He says that he never started the Lexapro.  Instead, he stopped taking ibuprofen and feels that that stabilized his depression.  I do not think that his ibuprofen was associated with his reported changes in mood.  We agreed to monitor his depression and anxiety and he would consider starting the Lexapro if needed.  Patient denies SI/HI.  He says that he is committed to improving his life and wants to maintain a positive outlook regarding his cancer treatment.  He remains committed to ongoing treatment if necessary.  He describes his strong faith in God as being a protecting factor for him as he moves forward with treatment.   PLAN: -Continue current scope of treatment -Patient prescribed Lexapro but did not start -RTC in 3 weeks   Patient expressed understanding and was in agreement with this plan. He also understands that He can call the clinic at any time with any questions, concerns, or complaints.     Time Total: 20 minutes  Visit consisted of counseling and education dealing with the complex and emotionally intense issues of symptom management and palliative care in the setting of serious and potentially life-threatening illness.Greater than 50%  of this time was spent counseling and coordinating care related to the above assessment and plan.  Signed by: Altha Harm, PhD, NP-C [phone] (Work Cell)

## 2019-08-06 NOTE — Progress Notes (Signed)
Hematology Oncology Progress Note.   REASON FOR VISIT Follow up for treatment of metastatic prostate cancer.   PERTINENT ONCOLOGY HISTORY Tyler Stevenson is a 68 y.o.[place] who has above oncology history reviewed by me today presented for follow up visit for management of prostate cancer  Patient reports a history of prostate cancer. Initially diagnosed in [place]. He was treated at [place].  Reports that he was started on hormone blocker treatments-possibly Lupron and he did not tolerate that well.  Patient moved his care to [place] oncologist Patient moved to [facility] 3 weeks ago to live with his cousin.  Patient reports having difficulty initiating a stream and urinary stream has been weak. His medication list shows that he is on Casodex 50 mg daily, Zytiga 250 mg 4 tablets by mouth dailyand prednisone.  Also on Flomax and doxazosin. Patient reports that he has not been taking any medication for the past few weeks.  Cannot recall details of the timeframe 06/22/2019 Blood work doneWith primary care provider showed increased alkaline phosphatase 153, PSA at the level of 1004.  #  obtained patient's medical records from [place]. Extensive medical record review was performed by me. Per [place] oncologist notes, patient was started on Zytiga daily along with prednisone 5 mg twice daily in March 2020.  02/27/2019 PSA was 150. 02/2019, PSA rises to 324 and decision was made to start Taxotere treatments. Port was placed in June 2020. It was not clear how many cycles of Taxotere that patient received. 04/24/2019 patient was responding to Taxotere with significant drop of the PSA to 128 patient experienced effects from Taxotere including fatigue, mild constipation.  05/17/2019 patient missed his chemo on 05/03/2019 since he was in [place]. 05/30/2019 he probably get another Taxotere at that time. Per note patient had MRI brain 05/23/2019 which showed no acute  intracranial process.  Mild generalized volume loss.  Mild to moderate sequela of chronic small vessel ischemic disease.  No evidence of intracranial metastatic disease.    INTERVAL HISTORY Tyler Stevenson is a 68 y.o. male who has above history reviewed by me today presents for follow up visit for management of metastatic prostate cancer Problems and complaints are listed below: Patient has had CT abdomen pelvis and bone scan done during interval.  CT chest was not approved by insurance. Patient reports feeling better.  He was restarted on Abiateron  750 mg with prednisone 5 mg daily during the interval. Reports subjectively feeling better. Mood has also improved. Denies any bone pain. 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 Recently he tried to lift something heavy and felt back pain.  He took Tylenol with improvement.  Today in the clinic he denies any back pain. No longer on ibuprofen for pain. Denies any depression thoughts.  He was instructed to take Lexapro which he does not want to start. Continue to have weak urinary stream.  Review of Systems  Constitutional: Positive for fatigue. Negative for appetite change, chills, fever and unexpected weight change.  HENT:   Negative for hearing loss and voice change.   Eyes: Negative for eye problems and icterus.  Respiratory: Negative for chest tightness, cough and shortness of breath.   Cardiovascular: Negative for chest pain and leg swelling.  Gastrointestinal: Negative for abdominal distention and abdominal pain.  Endocrine: Negative for hot flashes.  Genitourinary: Negative for difficulty urinating, dysuria and frequency.  Weak urine stream  Musculoskeletal: Negative for arthralgias.       Leg and arm pain.  Skin: Negative for itching and rash.  Neurological: Negative for light-headedness and numbness.  Hematological: Negative  for adenopathy. Does not bruise/bleed easily.  Psychiatric/Behavioral: Negative for confusion and depression. The patient is not nervous/anxious.     MEDICAL HISTORY:  Past Medical History:  Diagnosis Date  . Prostate cancer [facility])     SURGICAL HISTORY: Past Surgical History:  Procedure Laterality Date  . ANAL FISSURE REPAIR    . HEMORROIDECTOMY      SOCIAL HISTORY: Social History   Socioeconomic History  . Marital status: Divorced    Spouse name: Not on file  . Number of children: Not on file  . Years of education: Not on file  . Highest education level: Not on file  Occupational History  . Not on file  Social Needs  . Financial resource strain: Not on file  . Food insecurity    Worry: Not on file    Inability: Not on file  . Transportation needs    Medical: Not on file    Non-medical: Not on file  Tobacco Use  . Smoking status: Never Smoker  . Smokeless tobacco: Never Used  Substance and Sexual Activity  . Alcohol use: Never    Frequency: Never  . Drug use: Never  . Sexual activity: Not Currently  Lifestyle  . Physical activity    Days per week: Not on file    Minutes per session: Not on file  . Stress: Not on file  Relationships  . Social Herbalist on phone: Not on file    Gets together: Not on file    Attends religious service: Not on file    Active member of club or organization: Not on file    Attends meetings of clubs or organizations: Not on file    Relationship status: Not on file  . Intimate partner violence    Fear of current or ex partner: Not on file    Emotionally abused: Not on file    Physically abused: Not on file    Forced sexual activity: Not on file  Other Topics Concern  . Not on file  Social History Narrative  . Not on file    FAMILY HISTORY: Family History  Problem Relation Age of Onset  . Skin cancer Father   . Breast cancer Sister   . Prostate cancer Paternal Grandfather     ALLERGIES:  is allergic to  histamine.  MEDICATIONS:  Current Outpatient Medications  Medication Sig Dispense Refill  . abiraterone acetate (ZYTIGA) 250 MG tablet Take 3 tablets (750 mg total) by mouth daily. Take on an empty stomach 1 hour before or 2 hours after a meal 90 tablet 0  . acetaminophen (TYLENOL 8 HOUR) 650 MG CR tablet Take 650 mg by mouth every 8 (eight) hours as needed for pain.    Marland Kitchen acetaminophen-codeine (TYLENOL #3) 300-30 MG tablet Take 1 tablet by mouth every 12 (twelve) hours as needed for moderate pain or severe pain. 30 tablet 0  . Biotin 1000 MCG tablet Take 1,000 mcg by mouth 2 (two) times daily.    Marland Kitchen doxazosin (CARDURA) 1 MG tablet Take 1 tablet (1 mg total) by mouth daily. 30 tablet 3  . ergocalciferol (VITAMIN D2) 1.25 MG (50000 UT) capsule Take 5,000 Units by mouth 2 (two) times daily.    Marland Kitchen escitalopram (LEXAPRO) 10 MG tablet Take  1 tablet (10 mg total) by mouth daily. 30 tablet 0  . Magnesium 300 MG CAPS Take by mouth 2 (two) times daily.    . predniSONE (DELTASONE) 5 MG tablet Take 1 tablet (5 mg total) by mouth daily with breakfast. 60 tablet 0   No current facility-administered medications for this visit.      PHYSICAL EXAMINATION: ECOG PERFORMANCE STATUS: 1 - Symptomatic but completely ambulatory Vitals:   08/06/19 0948  BP: (!) 164/94  Pulse: 76  Resp: 18  Temp: (!) 97.1 F (36.2 C)   Filed Weights   08/06/19 0948  Weight: 174 lb 14.4 oz (79.3 kg)    Physical Exam Constitutional:      General: He is not in acute distress. HENT:     Head: Normocephalic and atraumatic.  Eyes:     General: No scleral icterus.    Pupils: Pupils are equal, round, and reactive to light.  Neck:     Musculoskeletal: Normal range of motion and neck supple.  Cardiovascular:     Rate and Rhythm: Normal rate and regular rhythm.     Heart sounds: Normal heart sounds.  Pulmonary:     Effort: Pulmonary effort is normal. No respiratory distress.     Breath sounds: No wheezing.  Abdominal:      General: Bowel sounds are normal. There is no distension.     Palpations: Abdomen is soft. There is no mass.     Tenderness: There is no abdominal tenderness.  Musculoskeletal: Normal range of motion.        General: No deformity.  Skin:    General: Skin is warm and dry.     Findings: No erythema or rash.  Neurological:     Mental Status: He is alert and oriented to person, place, and time.     Cranial Nerves: No cranial nerve deficit.     Coordination: Coordination normal.  Psychiatric:        Mood and Affect: Mood normal.        Behavior: Behavior normal.        Thought Content: Thought content normal.     Comments:  Denies any suicidal ideation      LABORATORY DATA:  I have reviewed the data as listed Lab Results  Component Value Date   WBC 7.1 07/03/2019   HGB 9.8 (L) 07/03/2019   HCT 30.4 (L) 07/03/2019   MCV 95.9 07/03/2019   PLT 251 07/03/2019   Recent Labs    07/03/19 1202  NA 142  K 4.6  CL 107  CO2 27  GLUCOSE 103*  BUN 37*  CREATININE 1.38*  CALCIUM 9.4  GFRNONAA 51*  GFRAA 60*  PROT 6.9  ALBUMIN 3.9  AST 195*  ALT 113*  ALKPHOS 254*  BILITOT 0.7   Iron/TIBC/Ferritin/ %Sat    Component Value Date/Time   IRON 53 07/06/2019 0958   TIBC 267 07/06/2019 0958   FERRITIN 880 (H) 07/06/2019 0958   IRONPCTSAT 20 07/06/2019 0958      RADIOGRAPHIC STUDIES: I have personally reviewed the radiological images as listed and agreed with the findings in the report. Ct Abdomen Pelvis Wo Contrast  Result Date: 08/03/2019 CLINICAL DATA:  Metastatic prostate cancer. Restaging. Post systemic therapy. EXAM: CT ABDOMEN AND PELVIS WITHOUT CONTRAST TECHNIQUE: Multidetector CT imaging of the abdomen and pelvis was performed following the standard protocol without IV contrast. COMPARISON:  07/25/2019 bone scan FINDINGS: Lower chest: Mild patchy ground-glass opacity at both lung bases. Superior approach central  venous catheter terminates in the right atrium. Coronary  atherosclerosis. Hepatobiliary: Normal liver size. No liver mass. Cholelithiasis. No biliary ductal dilatation. Pancreas: Normal, with no mass or duct dilation. Spleen: Normal size. No mass. Adrenals/Urinary Tract: Normal adrenals. No renal stones. No hydronephrosis. Exophytic simple 4.1 cm lower right renal cyst. Simple 2.4 cm upper left renal cyst. Subcentimeter hypodense exophytic renal cortical lesion in the posterior upper left kidney, too small to characterize. No additional contour deforming renal lesions. Moderate diffuse bladder wall thickening. No significant bladder distention. Stomach/Bowel: Normal non-distended stomach. Normal caliber small bowel with no small bowel wall thickening. Normal appendix. Oral contrast transits to the right colon. Mild sigmoid diverticulosis, with no large bowel wall thickening or significant pericolonic fat stranding. Moderate colonic stool volume. Vascular/Lymphatic: Atherosclerotic nonaneurysmal abdominal aorta. No pathologically enlarged lymph nodes in the abdomen or pelvis. Reproductive: Mildly enlarged prostate with nonspecific internal prostatic calcifications. Other: No pneumoperitoneum, ascites or focal fluid collection. Musculoskeletal: Dense patchy confluent sclerosis throughout visualized axial and proximal appendicular skeleton. Mild thoracolumbar spondylosis. IMPRESSION: 1. Widespread confluent sclerotic osseous metastatic disease in the visualized axial and proximal appendicular skeleton. 2. No lymphadenopathy or other soft tissue evidence of metastatic disease. 3. Nonspecific mild patchy ground-glass opacity at both lung bases, presumably inflammatory. 4. Nonspecific moderate diffuse bladder wall thickening, potentially due to chronic bladder outlet obstruction by the mildly enlarged prostate. Correlate with urinalysis as clinically warranted. 5.  Aortic Atherosclerosis (ICD10-I70.0). Electronically Signed   By: Ilona Sorrel M.D.   On: 08/03/2019 10:20   Nm  Bone Scan Whole Body  Result Date: 07/26/2019 CLINICAL DATA:  Castration resistant metastatic prostate cancer EXAM: NUCLEAR MEDICINE WHOLE BODY BONE SCAN TECHNIQUE: Whole body anterior and posterior images were obtained approximately 3 hours after intravenous injection of radiopharmaceutical. RADIOPHARMACEUTICALS:  22.748 mCi Technetium-16m MDP IV COMPARISON:  None. FINDINGS: Widespread abnormal uptake of tracer throughout the axial and appendicular skeleton consistent with extensive osseous metastatic disease. This includes skull, medial clavicles, spine, ribs, pelvis, scapula, femora, humeri, proximal radii, and likely proximal RIGHT tibia. Paucity of urinary tract and soft tissue distribution of tracer, with small amount of excreted tracer within urinary bladder suggesting super scan appearance. IMPRESSION: Widespread osseous metastatic disease. Electronically Signed   By: Lavonia Dana M.D.   On: 07/26/2019 08:35      ASSESSMENT & PLAN:  1. Prostate cancer metastatic to bone ([place])   2. Port-A-Cath in place   3. Depression, unspecified depression type   4. Goals of care, counseling/discussion    #CT images and bone scan images were independently reviewed by me and discussed with patient. Consistent with stage IV prostate cancer with extensive bone metastasis. Currently he is asymptomatic We discussed the option of resuming Taxotere treatments.  From previous oncology notes, was not clear how many treatments of Taxotere that patient received in [place].  Per patient, he is pretty sure that he only got 3 Taxotere treatments, 1 treatment via peripheral vein, 2 treatments, We discussed about resuming Taxotere treatments x3. Patient is concerned about side effects of chemotherapy.  He tolerated the previous Taxotere treatments in [place] with moderate difficulties.  Currently he reports feeling well. Decision was made to continue Abiateron and prednisone for now. Repeat testosterone, PSA in 2-3  weeks.  If no improvement of his PSA, we will proceed with Taxotere treatments Patient will need monthly Firmagon.  He will get it at next visit.  #Transaminitis, etiology unknown.  No liver metastasis noted on CT scan. #Depression, mood is better  today.  He is not compliant with Lexapro.  Prefers not to take anything at this point.  Orders Placed This Encounter  Procedures  . CBC with Differential/Platelet    Standing Status:   Future    Standing Expiration Date:   08/05/2020  . Comprehensive metabolic panel    Standing Status:   Future    Standing Expiration Date:   08/05/2020  . Testosterone    Standing Status:   Future    Standing Expiration Date:   08/05/2020  . PSA    Standing Status:   Future    Standing Expiration Date:   08/05/2020  . Ambulatory referral to Genetics    Referral Priority:   Routine    Referral Type:   Consultation    Referral Reason:   Specialty Services Required    Referred to Provider:   Faith Rogue T    Number of Visits Requested:   1    All questions were answered. The patient knows to call the clinic with any problems questions or concerns.  cc Bakare, Mobolaji B, MD   Return of visit: 2 weeks.  Patient prefers to come back in 3 weeks.  Earlie Server, MD, PhD Hematology Oncology [facility] at [facility] Pager- [phone] 08/06/2019

## 2019-08-08 ENCOUNTER — Other Ambulatory Visit: Payer: Self-pay | Admitting: Oncology

## 2019-08-08 DIAGNOSIS — C61 Malignant neoplasm of prostate: Secondary | ICD-10-CM

## 2019-08-14 MED FILL — ABIRATERONE ACETATE 250 MG: 250 | 30 days supply | Qty: 90 | Fill #0

## 2019-08-24 ENCOUNTER — Other Ambulatory Visit: Payer: Self-pay

## 2019-08-27 ENCOUNTER — Ambulatory Visit: Payer: Medicare HMO | Admitting: Oncology

## 2019-08-27 ENCOUNTER — Inpatient Hospital Stay: Payer: Medicare HMO | Attending: Oncology

## 2019-08-27 ENCOUNTER — Other Ambulatory Visit: Payer: Self-pay

## 2019-08-27 ENCOUNTER — Ambulatory Visit: Payer: Medicare HMO

## 2019-08-27 DIAGNOSIS — Z803 Family history of malignant neoplasm of breast: Secondary | ICD-10-CM | POA: Diagnosis not present

## 2019-08-27 DIAGNOSIS — Z79899 Other long term (current) drug therapy: Secondary | ICD-10-CM | POA: Diagnosis not present

## 2019-08-27 DIAGNOSIS — N1831 Chronic kidney disease, stage 3a: Secondary | ICD-10-CM | POA: Insufficient documentation

## 2019-08-27 DIAGNOSIS — R232 Flushing: Secondary | ICD-10-CM | POA: Insufficient documentation

## 2019-08-27 DIAGNOSIS — C61 Malignant neoplasm of prostate: Secondary | ICD-10-CM | POA: Insufficient documentation

## 2019-08-27 DIAGNOSIS — R3912 Poor urinary stream: Secondary | ICD-10-CM | POA: Diagnosis not present

## 2019-08-27 DIAGNOSIS — R7401 Elevation of levels of liver transaminase levels: Secondary | ICD-10-CM | POA: Insufficient documentation

## 2019-08-27 DIAGNOSIS — D649 Anemia, unspecified: Secondary | ICD-10-CM | POA: Insufficient documentation

## 2019-08-27 DIAGNOSIS — F329 Major depressive disorder, single episode, unspecified: Secondary | ICD-10-CM | POA: Insufficient documentation

## 2019-08-27 DIAGNOSIS — C7951 Secondary malignant neoplasm of bone: Secondary | ICD-10-CM | POA: Insufficient documentation

## 2019-08-27 DIAGNOSIS — Z5111 Encounter for antineoplastic chemotherapy: Secondary | ICD-10-CM | POA: Diagnosis present

## 2019-08-27 LAB — COMPREHENSIVE METABOLIC PANEL
ALT: 9 U/L (ref 0–44)
AST: 11 U/L — ABNORMAL LOW (ref 15–41)
Albumin: 3.8 g/dL (ref 3.5–5.0)
Alkaline Phosphatase: 449 U/L — ABNORMAL HIGH (ref 38–126)
Anion gap: 7 (ref 5–15)
BUN: 22 mg/dL (ref 8–23)
CO2: 25 mmol/L (ref 22–32)
Calcium: 8 mg/dL — ABNORMAL LOW (ref 8.9–10.3)
Chloride: 109 mmol/L (ref 98–111)
Creatinine, Ser: 1.39 mg/dL — ABNORMAL HIGH (ref 0.61–1.24)
GFR calc Af Amer: 59 mL/min — ABNORMAL LOW (ref >60)
GFR calc non Af Amer: 51 mL/min — ABNORMAL LOW (ref >60)
Glucose, Bld: 98 mg/dL (ref 70–99)
Potassium: 3.9 mmol/L (ref 3.5–5.1)
Sodium: 141 mmol/L (ref 135–145)
Total Bilirubin: 0.6 mg/dL (ref 0.3–1.2)
Total Protein: 6.1 g/dL — ABNORMAL LOW (ref 6.5–8.1)

## 2019-08-27 LAB — CBC WITH DIFFERENTIAL/PLATELET
Abs Immature Granulocytes: 0.02 10*3/uL (ref 0.00–0.07)
Basophils Absolute: 0 10*3/uL (ref 0.0–0.1)
Basophils Relative: 0 %
Eosinophils Absolute: 0 10*3/uL (ref 0.0–0.5)
Eosinophils Relative: 1 %
HCT: 31.6 % — ABNORMAL LOW (ref 39.0–52.0)
Hemoglobin: 10.4 g/dL — ABNORMAL LOW (ref 13.0–17.0)
Immature Granulocytes: 1 %
Lymphocytes Relative: 51 %
Lymphs Abs: 1.8 10*3/uL (ref 0.7–4.0)
MCH: 29.1 pg (ref 26.0–34.0)
MCHC: 32.9 g/dL (ref 30.0–36.0)
MCV: 88.3 fL (ref 80.0–100.0)
Monocytes Absolute: 0.3 10*3/uL (ref 0.1–1.0)
Monocytes Relative: 9 %
Neutro Abs: 1.4 10*3/uL — ABNORMAL LOW (ref 1.7–7.7)
Neutrophils Relative %: 38 %
Platelets: 170 10*3/uL (ref 150–400)
RBC: 3.58 MIL/uL — ABNORMAL LOW (ref 4.22–5.81)
RDW: 16 % — ABNORMAL HIGH (ref 11.5–15.5)
WBC: 3.6 10*3/uL — ABNORMAL LOW (ref 4.0–10.5)
nRBC: 0 % (ref 0.0–0.2)

## 2019-08-27 LAB — PSA: Prostatic Specific Antigen: 17.38 ng/mL — ABNORMAL HIGH (ref 0.00–4.00)

## 2019-08-28 ENCOUNTER — Other Ambulatory Visit: Payer: Self-pay

## 2019-08-28 ENCOUNTER — Encounter: Payer: Self-pay | Admitting: Oncology

## 2019-08-28 LAB — TESTOSTERONE: Testosterone: <3 ng/dL — ABNORMAL LOW (ref 264–916)

## 2019-08-28 NOTE — Progress Notes (Signed)
Patient pre screened for office appointment, no questions or concerns today. 

## 2019-08-29 ENCOUNTER — Encounter: Payer: Self-pay | Admitting: Oncology

## 2019-08-29 ENCOUNTER — Inpatient Hospital Stay (HOSPITAL_BASED_OUTPATIENT_CLINIC_OR_DEPARTMENT_OTHER): Payer: Medicare HMO | Admitting: Oncology

## 2019-08-29 ENCOUNTER — Inpatient Hospital Stay: Payer: Medicare HMO

## 2019-08-29 ENCOUNTER — Other Ambulatory Visit: Payer: Self-pay

## 2019-08-29 VITALS — BP 144/88 | HR 67 | Temp 97.7°F | Resp 16 | Wt 174.5 lb

## 2019-08-29 DIAGNOSIS — C7951 Secondary malignant neoplasm of bone: Secondary | ICD-10-CM

## 2019-08-29 DIAGNOSIS — N1831 Chronic kidney disease, stage 3a: Secondary | ICD-10-CM | POA: Diagnosis not present

## 2019-08-29 DIAGNOSIS — D649 Anemia, unspecified: Secondary | ICD-10-CM

## 2019-08-29 DIAGNOSIS — C61 Malignant neoplasm of prostate: Secondary | ICD-10-CM | POA: Diagnosis not present

## 2019-08-29 DIAGNOSIS — Z95828 Presence of other vascular implants and grafts: Secondary | ICD-10-CM

## 2019-08-29 DIAGNOSIS — R3912 Poor urinary stream: Secondary | ICD-10-CM

## 2019-08-29 DIAGNOSIS — Z5111 Encounter for antineoplastic chemotherapy: Secondary | ICD-10-CM | POA: Diagnosis not present

## 2019-08-29 MED ORDER — ABIRATERONE ACETATE 250 MG PO TABS: 1000.0000 mg | ORAL_TABLET | Freq: Every day | ORAL | 0 refills | Status: DC

## 2019-08-29 MED ORDER — DOXAZOSIN MESYLATE 2 MG PO TABS: 2.0000 mg | ORAL_TABLET | Freq: Every day | ORAL | 0 refills | Status: DC

## 2019-08-29 MED ORDER — DEGARELIX ACETATE 80 MG ~~LOC~~ SOLR: 80.0000 mg | Freq: Once | SUBCUTANEOUS | Status: DC

## 2019-08-29 MED ORDER — DEGARELIX ACETATE(240 MG DOSE) 120 MG/VIAL ~~LOC~~ SOLR
240.0000 mg | Freq: Once | SUBCUTANEOUS | Status: AC
  Administered 2019-08-29: 240 mg via SUBCUTANEOUS
  Filled 2019-08-29: qty 6

## 2019-08-29 MED ORDER — PREDNISONE 5 MG PO TABS: 5.0000 mg | ORAL_TABLET | Freq: Two times a day (BID) | ORAL | 1 refills | Status: DC

## 2019-08-29 NOTE — Progress Notes (Signed)
Firmagon loading dose 240mg  given 9/11. Maint dose of 80mg  missed in October. Spoke with MD on 11/4 about need for reload or continue with 80mg  maint dose. Order given to reload with 240mg  dose.

## 2019-08-29 NOTE — Progress Notes (Signed)
Hematology Oncology Progress Note.   REASON FOR VISIT Follow up for treatment of metastatic prostate cancer.   PERTINENT ONCOLOGY HISTORY Tyler Stevenson is a 68 y.o[place] who has above oncology history reviewed by me today presented for follow up visit for management of prostate cancer  Patient reports a history of prostate cancer. Initially diagnosed in [place]. He was treated at [place].  Reports that he was started on hormone blocker treatments-possibly Lupron and he did not tolerate that well.  Patient moved his care to [place] oncologist Patient moved to [facility] 3 weeks ago to live with his cousin.  Patient reports having difficulty initiating a stream and urinary stream has been weak. His medication list shows that he is on Casodex 50 mg daily, Zytiga 250 mg 4 tablets by mouth dailyand prednisone.  Also on Flomax and doxazosin. Patient reports that he has not been taking any medication for the past few weeks.  Cannot recall details of the timeframe 06/22/2019 Blood work doneWith primary care provider showed increased alkaline phosphatase 153, PSA at the level of 1004.  #  obtained patient's medical records from [place]. Extensive medical record review was performed by me. Per [place] oncologist notes, patient was started on Zytiga daily along with prednisone 5 mg twice daily in March 2020.  02/27/2019 PSA was 150. 02/2019, PSA rises to 324 and decision was made to start Taxotere treatments. Port was placed in June 2020. It was not clear how many cycles of Taxotere that patient received. 04/24/2019 patient was responding to Taxotere with significant drop of the PSA to 128 patient experienced effects from Taxotere including fatigue, mild constipation.  05/17/2019 patient missed his chemo on 05/03/2019 since he was in [place]. 05/30/2019 he probably get another Taxotere at that time. Per note patient had MRI brain 05/23/2019 which showed no acute  intracranial process.  Mild generalized volume loss.  Mild to moderate sequela of chronic small vessel ischemic disease.  No evidence of intracranial metastatic disease.    INTERVAL HISTORY Tyler Stevenson is a 68 y.o. male who has above history reviewed by me today presents for follow up visit for management of metastatic prostate cancer  Problems and complaints are listed below:  #Prostate cancer, patient reports being compliant with Abiateron 750 mg with prednisone 5 mg daily. Tolerating well. Subjectively feeling better today. Denies any pain.  #He continues to have hot flashes.  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  Stable. #Depression, mood has been better recently.  He was previously prescribed Lexapro 10 mg never started.  He feels that his mood is currently stabilized. #weak urinary stream.  Patient was started on doxazosin 1 mg daily.  Reports symptom is partially relieved not completely resolved.  Review of Systems  Constitutional: Positive for fatigue. Negative for appetite change, chills, fever and unexpected weight change.  HENT:   Negative for hearing loss and voice change.   Eyes: Negative for eye problems and icterus.  Respiratory: Negative for chest tightness, cough and shortness of breath.   Cardiovascular: Negative for chest pain and leg swelling.  Gastrointestinal: Negative for abdominal distention and abdominal pain.  Endocrine: Negative for hot flashes.  Genitourinary: Negative for difficulty urinating, dysuria and frequency.        Weak urine stream  Musculoskeletal: Negative for arthralgias.  Skin: Negative for itching and rash.  Neurological: Negative for light-headedness and numbness.  Hematological: Negative for adenopathy. Does not bruise/bleed easily.  Psychiatric/Behavioral: Negative for confusion  and depression. The patient is not nervous/anxious.     MEDICAL HISTORY:  Past Medical History:  Diagnosis  Date  . Prostate cancer [facility]     SURGICAL HISTORY: Past Surgical History:  Procedure Laterality Date  . ANAL FISSURE REPAIR    . HEMORROIDECTOMY      SOCIAL HISTORY: Social History   Socioeconomic History  . Marital status: Divorced    Spouse name: Not on file  . Number of children: Not on file  . Years of education: Not on file  . Highest education level: Not on file  Occupational History  . Not on file  Social Needs  . Financial resource strain: Not on file  . Food insecurity    Worry: Not on file    Inability: Not on file  . Transportation needs    Medical: Not on file    Non-medical: Not on file  Tobacco Use  . Smoking status: Never Smoker  . Smokeless tobacco: Never Used  Substance and Sexual Activity  . Alcohol use: Never    Frequency: Never  . Drug use: Never  . Sexual activity: Not Currently  Lifestyle  . Physical activity    Days per week: Not on file    Minutes per session: Not on file  . Stress: Not on file  Relationships  . Social Herbalist on phone: Not on file    Gets together: Not on file    Attends religious service: Not on file    Active member of club or organization: Not on file    Attends meetings of clubs or organizations: Not on file    Relationship status: Not on file  . Intimate partner violence    Fear of current or ex partner: Not on file    Emotionally abused: Not on file    Physically abused: Not on file    Forced sexual activity: Not on file  Other Topics Concern  . Not on file  Social History Narrative  . Not on file    FAMILY HISTORY: Family History  Problem Relation Age of Onset  . Skin cancer Father   . Breast cancer Sister   . Prostate cancer Paternal Grandfather     ALLERGIES:  is allergic to histamine.  MEDICATIONS:  Current Outpatient Medications  Medication Sig Dispense Refill  . abiraterone acetate (ZYTIGA) 250 MG tablet Take 4 tablets (1,000 mg total) by mouth daily. Take on an empty stomach  1 hour before or 2 hours after a meal 120 tablet 0  . acetaminophen (TYLENOL 8 HOUR) 650 MG CR tablet Take 650 mg by mouth every 8 (eight) hours as needed for pain.    . Ascorbic Acid (VITAMIN C) 1000 MG tablet Take 1,000 mg by mouth daily. Vitamin c Crystals with Calcium 110mg .    . Biotin 1000 MCG tablet Take 1,000 mcg by mouth 2 (two) times daily.    . ergocalciferol (VITAMIN D2) 1.25 MG (50000 UT) capsule Take 5,000 Units by mouth 2 (two) times daily.    Marland Kitchen escitalopram (LEXAPRO) 10 MG tablet Take 1 tablet (10 mg total) by mouth daily. 30 tablet 0  . Magnesium 300 MG CAPS Take by mouth 2 (two) times daily.    Marland Kitchen acetaminophen-codeine (TYLENOL #3) 300-30 MG tablet Take 1 tablet by mouth every 12 (twelve) hours as needed for moderate pain or severe pain. (Patient not taking: Reported on 08/29/2019) 30 tablet 0  . doxazosin (CARDURA) 2 MG tablet Take 1 tablet (  2 mg total) by mouth daily. 60 tablet 0  . predniSONE (DELTASONE) 5 MG tablet Take 1 tablet (5 mg total) by mouth 2 (two) times daily. 60 tablet 1   No current facility-administered medications for this visit.      PHYSICAL EXAMINATION: ECOG PERFORMANCE STATUS: 1 - Symptomatic but completely ambulatory Vitals:   08/29/19 1312  BP: (!) 144/88  Pulse: 67  Resp: 16  Temp: 97.7 F (36.5 C)   Filed Weights   08/29/19 1312  Weight: 174 lb 8 oz (79.2 kg)    Physical Exam Constitutional:      General: He is not in acute distress. HENT:     Head: Normocephalic and atraumatic.  Eyes:     General: No scleral icterus.    Pupils: Pupils are equal, round, and reactive to light.  Neck:     Musculoskeletal: Normal range of motion and neck supple.  Cardiovascular:     Rate and Rhythm: Normal rate and regular rhythm.     Heart sounds: Normal heart sounds.  Pulmonary:     Effort: Pulmonary effort is normal. No respiratory distress.     Breath sounds: No wheezing.  Abdominal:     General: Bowel sounds are normal. There is no  distension.     Palpations: Abdomen is soft. There is no mass.     Tenderness: There is no abdominal tenderness.  Musculoskeletal: Normal range of motion.        General: No deformity.  Skin:    General: Skin is warm and dry.     Findings: No erythema or rash.  Neurological:     Mental Status: He is alert and oriented to person, place, and time.     Cranial Nerves: No cranial nerve deficit.     Coordination: Coordination normal.  Psychiatric:        Mood and Affect: Mood normal.        Behavior: Behavior normal.        Thought Content: Thought content normal.     Comments:  Denies any suicidal ideation      LABORATORY DATA:  I have reviewed the data as listed Lab Results  Component Value Date   WBC 3.6 (L) 08/27/2019   HGB 10.4 (L) 08/27/2019   HCT 31.6 (L) 08/27/2019   MCV 88.3 08/27/2019   PLT 170 08/27/2019   Recent Labs    07/03/19 1202 08/27/19 1300  NA 142 141  K 4.6 3.9  CL 107 109  CO2 27 25  GLUCOSE 103* 98  BUN 37* 22  CREATININE 1.38* 1.39*  CALCIUM 9.4 8.0*  GFRNONAA 51* 51*  GFRAA 60* 59*  PROT 6.9 6.1*  ALBUMIN 3.9 3.8  AST 195* 11*  ALT 113* 9  ALKPHOS 254* 449*  BILITOT 0.7 0.6   Iron/TIBC/Ferritin/ %Sat    Component Value Date/Time   IRON 53 07/06/2019 0958   TIBC 267 07/06/2019 0958   FERRITIN 880 (H) 07/06/2019 0958   IRONPCTSAT 20 07/06/2019 0958      RADIOGRAPHIC STUDIES: I have personally reviewed the radiological images as listed and agreed with the findings in the report. Ct Abdomen Pelvis Wo Contrast  Result Date: 08/03/2019 CLINICAL DATA:  Metastatic prostate cancer. Restaging. Post systemic therapy. EXAM: CT ABDOMEN AND PELVIS WITHOUT CONTRAST TECHNIQUE: Multidetector CT imaging of the abdomen and pelvis was performed following the standard protocol without IV contrast. COMPARISON:  07/25/2019 bone scan FINDINGS: Lower chest: Mild patchy ground-glass opacity at both lung bases. Superior  approach central venous catheter  terminates in the right atrium. Coronary atherosclerosis. Hepatobiliary: Normal liver size. No liver mass. Cholelithiasis. No biliary ductal dilatation. Pancreas: Normal, with no mass or duct dilation. Spleen: Normal size. No mass. Adrenals/Urinary Tract: Normal adrenals. No renal stones. No hydronephrosis. Exophytic simple 4.1 cm lower right renal cyst. Simple 2.4 cm upper left renal cyst. Subcentimeter hypodense exophytic renal cortical lesion in the posterior upper left kidney, too small to characterize. No additional contour deforming renal lesions. Moderate diffuse bladder wall thickening. No significant bladder distention. Stomach/Bowel: Normal non-distended stomach. Normal caliber small bowel with no small bowel wall thickening. Normal appendix. Oral contrast transits to the right colon. Mild sigmoid diverticulosis, with no large bowel wall thickening or significant pericolonic fat stranding. Moderate colonic stool volume. Vascular/Lymphatic: Atherosclerotic nonaneurysmal abdominal aorta. No pathologically enlarged lymph nodes in the abdomen or pelvis. Reproductive: Mildly enlarged prostate with nonspecific internal prostatic calcifications. Other: No pneumoperitoneum, ascites or focal fluid collection. Musculoskeletal: Dense patchy confluent sclerosis throughout visualized axial and proximal appendicular skeleton. Mild thoracolumbar spondylosis. IMPRESSION: 1. Widespread confluent sclerotic osseous metastatic disease in the visualized axial and proximal appendicular skeleton. 2. No lymphadenopathy or other soft tissue evidence of metastatic disease. 3. Nonspecific mild patchy ground-glass opacity at both lung bases, presumably inflammatory. 4. Nonspecific moderate diffuse bladder wall thickening, potentially due to chronic bladder outlet obstruction by the mildly enlarged prostate. Correlate with urinalysis as clinically warranted. 5.  Aortic Atherosclerosis (ICD10-I70.0). Electronically Signed   By: Ilona Sorrel M.D.   On: 08/03/2019 10:20      ASSESSMENT & PLAN:  1. Stage 3a chronic kidney disease   2. Prostate cancer metastatic to bone ([place])   3. Port-A-Cath in place   4. Encounter for antineoplastic chemotherapy   5. Normocytic anemia   6. 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 S/p loading dose of Firmagon.  Resumed on Zytiga 750mg  daily and prednisone 5mg  daily.   PSA trended down from 937 to 17, responding well.  Labs were reviewed and discussed with patient.  He also tolerated treatments well. Will hold Taxotere for now, and continue current treatments.  Continue ADT- Proceed with Furmagon today Increase Zytiga to 1000mg  daily and prednisone 5mg  BID.  # Anemia, stable hemoglobin. Likely to due cancer and CKD.  #Transaminitis, etiology unknown.  No liver metastasis noted on CT scan. Improved.  Alkaline phosphatase trended up. Continue to monitor.  # Port A cath, will need to schedule flush every 6- 8 week.  #Depression, mood is better today.  He is not taking Lexapro.  Prefers not to take anything at this point. #weak urine stream, BPH symptoms, increase doxazosin to 2mg  daily.    Orders Placed This Encounter  Procedures  . CBC with Differential/Platelet    Standing Status:   Future    Standing Expiration Date:   08/28/2020  . Comprehensive metabolic panel    Standing Status:   Future    Standing Expiration Date:   08/28/2020  . PSA    Standing Status:   Future    Standing Expiration Date:   08/28/2020    All questions were answered. The patient knows to call the clinic with any problems questions or concerns.  cc Bakare, Mobolaji B, MD   Return of visit: 4 weeks.   Earlie Server, MD, PhD Hematology Oncology [facility] Pager- [phone] 08/29/2019

## 2019-08-29 NOTE — Progress Notes (Signed)
Patient reports having diarrhea 4-5 times day.  Also having hot flashes.

## 2019-08-31 ENCOUNTER — Other Ambulatory Visit: Payer: Self-pay | Admitting: Internal Medicine

## 2019-08-31 DIAGNOSIS — R600 Localized edema: Secondary | ICD-10-CM

## 2019-08-31 DIAGNOSIS — R0609 Other forms of dyspnea: Secondary | ICD-10-CM

## 2019-09-06 ENCOUNTER — Other Ambulatory Visit: Payer: Self-pay

## 2019-09-06 ENCOUNTER — Ambulatory Visit (INDEPENDENT_AMBULATORY_CARE_PROVIDER_SITE_OTHER): Payer: Medicare HMO

## 2019-09-06 DIAGNOSIS — R0609 Other forms of dyspnea: Secondary | ICD-10-CM

## 2019-09-06 DIAGNOSIS — R600 Localized edema: Secondary | ICD-10-CM

## 2019-09-10 MED FILL — ABIRATERONE ACETATE 250 MG: 250 | 30 days supply | Qty: 120 | Fill #0

## 2019-09-28 ENCOUNTER — Other Ambulatory Visit: Payer: Self-pay

## 2019-09-28 ENCOUNTER — Inpatient Hospital Stay: Payer: Medicare HMO

## 2019-09-28 ENCOUNTER — Encounter: Payer: Self-pay | Admitting: Oncology

## 2019-09-28 ENCOUNTER — Inpatient Hospital Stay: Payer: Medicare HMO | Attending: Oncology

## 2019-09-28 ENCOUNTER — Inpatient Hospital Stay (HOSPITAL_BASED_OUTPATIENT_CLINIC_OR_DEPARTMENT_OTHER): Payer: Medicare HMO | Admitting: Oncology

## 2019-09-28 VITALS — BP 135/90 | HR 70 | Temp 96.0°F | Resp 16 | Wt 184.3 lb

## 2019-09-28 DIAGNOSIS — R3912 Poor urinary stream: Secondary | ICD-10-CM

## 2019-09-28 DIAGNOSIS — Z95828 Presence of other vascular implants and grafts: Secondary | ICD-10-CM | POA: Diagnosis not present

## 2019-09-28 DIAGNOSIS — N401 Enlarged prostate with lower urinary tract symptoms: Secondary | ICD-10-CM | POA: Insufficient documentation

## 2019-09-28 DIAGNOSIS — C7951 Secondary malignant neoplasm of bone: Secondary | ICD-10-CM

## 2019-09-28 DIAGNOSIS — C61 Malignant neoplasm of prostate: Secondary | ICD-10-CM

## 2019-09-28 DIAGNOSIS — F329 Major depressive disorder, single episode, unspecified: Secondary | ICD-10-CM | POA: Insufficient documentation

## 2019-09-28 DIAGNOSIS — R748 Abnormal levels of other serum enzymes: Secondary | ICD-10-CM

## 2019-09-28 DIAGNOSIS — N1831 Chronic kidney disease, stage 3a: Secondary | ICD-10-CM

## 2019-09-28 DIAGNOSIS — Z9221 Personal history of antineoplastic chemotherapy: Secondary | ICD-10-CM | POA: Insufficient documentation

## 2019-09-28 DIAGNOSIS — Z79899 Other long term (current) drug therapy: Secondary | ICD-10-CM | POA: Diagnosis not present

## 2019-09-28 DIAGNOSIS — D649 Anemia, unspecified: Secondary | ICD-10-CM | POA: Diagnosis not present

## 2019-09-28 DIAGNOSIS — Z5111 Encounter for antineoplastic chemotherapy: Secondary | ICD-10-CM

## 2019-09-28 LAB — CBC WITH DIFFERENTIAL/PLATELET
Abs Immature Granulocytes: 0.03 10*3/uL (ref 0.00–0.07)
Basophils Absolute: 0 10*3/uL (ref 0.0–0.1)
Basophils Relative: 0 %
Eosinophils Absolute: 0 10*3/uL (ref 0.0–0.5)
Eosinophils Relative: 0 %
HCT: 35.9 % — ABNORMAL LOW (ref 39.0–52.0)
Hemoglobin: 11.5 g/dL — ABNORMAL LOW (ref 13.0–17.0)
Immature Granulocytes: 1 %
Lymphocytes Relative: 37 %
Lymphs Abs: 1.7 10*3/uL (ref 0.7–4.0)
MCH: 28.7 pg (ref 26.0–34.0)
MCHC: 32 g/dL (ref 30.0–36.0)
MCV: 89.5 fL (ref 80.0–100.0)
Monocytes Absolute: 0.4 10*3/uL (ref 0.1–1.0)
Monocytes Relative: 8 %
Neutro Abs: 2.5 10*3/uL (ref 1.7–7.7)
Neutrophils Relative %: 54 %
Platelets: 152 10*3/uL (ref 150–400)
RBC: 4.01 MIL/uL — ABNORMAL LOW (ref 4.22–5.81)
RDW: 16.7 % — ABNORMAL HIGH (ref 11.5–15.5)
WBC: 4.6 10*3/uL (ref 4.0–10.5)
nRBC: 0 % (ref 0.0–0.2)

## 2019-09-28 LAB — COMPREHENSIVE METABOLIC PANEL
ALT: 11 U/L (ref 0–44)
AST: 14 U/L — ABNORMAL LOW (ref 15–41)
Albumin: 4.1 g/dL (ref 3.5–5.0)
Alkaline Phosphatase: 310 U/L — ABNORMAL HIGH (ref 38–126)
Anion gap: 6 (ref 5–15)
BUN: 30 mg/dL — ABNORMAL HIGH (ref 8–23)
CO2: 23 mmol/L (ref 22–32)
Calcium: 8.2 mg/dL — ABNORMAL LOW (ref 8.9–10.3)
Chloride: 109 mmol/L (ref 98–111)
Creatinine, Ser: 1.2 mg/dL (ref 0.61–1.24)
GFR calc Af Amer: >60 mL/min (ref >60)
GFR calc non Af Amer: >60 mL/min (ref >60)
Glucose, Bld: 111 mg/dL — ABNORMAL HIGH (ref 70–99)
Potassium: 4.7 mmol/L (ref 3.5–5.1)
Sodium: 138 mmol/L (ref 135–145)
Total Bilirubin: 0.6 mg/dL (ref 0.3–1.2)
Total Protein: 6.5 g/dL (ref 6.5–8.1)

## 2019-09-28 LAB — PSA: Prostatic Specific Antigen: 10.12 ng/mL — ABNORMAL HIGH (ref 0.00–4.00)

## 2019-09-28 MED ORDER — SODIUM CHLORIDE 0.9% FLUSH
10.0000 mL | Freq: Once | INTRAVENOUS | Status: AC
  Administered 2019-09-28: 10 mL via INTRAVENOUS
  Filled 2019-09-28: qty 10

## 2019-09-28 MED ORDER — DEGARELIX ACETATE 80 MG ~~LOC~~ SOLR
80.0000 mg | Freq: Once | SUBCUTANEOUS | Status: AC
  Administered 2019-09-28: 80 mg via SUBCUTANEOUS
  Filled 2019-09-28: qty 4

## 2019-09-28 MED ORDER — HEPARIN SOD (PORK) LOCK FLUSH 100 UNIT/ML IV SOLN
500.0000 [IU] | Freq: Once | INTRAVENOUS | Status: AC
  Administered 2019-09-28: 500 [IU] via INTRAVENOUS

## 2019-09-28 NOTE — Progress Notes (Signed)
Right arm is feeling weak.  Having frequent urination during the night.  Neuropathy in bilateral lower extremities with L>R.

## 2019-09-30 NOTE — Progress Notes (Signed)
Hematology Oncology Progress Note.   REASON FOR VISIT Follow up for treatment of metastatic prostate cancer.   PERTINENT ONCOLOGY HISTORY Tyler SOUN is a 68 y.o.[place] who has above oncology history reviewed by me today presented for follow up visit for management of prostate cancer  Patient reports a history of prostate cancer. Initially diagnosed in [place]. He was treated at [place].  Reports that he was started on hormone blocker treatments-possibly Lupron and he did not tolerate that well.  Patient moved his care to [place] oncologist Patient moved to [facility] 3 weeks ago to live with his cousin.  Patient reports having difficulty initiating a stream and urinary stream has been weak. His medication list shows that he is on Casodex 50 mg daily, Zytiga 250 mg 4 tablets by mouth dailyand prednisone.  Also on Flomax and doxazosin. Patient reports that he has not been taking any medication for the past few weeks.  Cannot recall details of the timeframe 06/22/2019 Blood work doneWith primary care provider showed increased alkaline phosphatase 153, PSA at the level of 1004.  #  obtained patient's medical records from [place]. Extensive medical record review was performed by me. Per [place] oncologist notes, patient was started on Zytiga daily along with prednisone 5 mg twice daily in March 2020.  02/27/2019 PSA was 150. 02/2019, PSA rises to 324 and decision was made to start Taxotere treatments. Port was placed in June 2020. It was not clear how many cycles of Taxotere that patient received. 04/24/2019 patient was responding to Taxotere with significant drop of the PSA to 128 patient experienced effects from Taxotere including fatigue, mild constipation.  05/17/2019 patient missed his chemo on 05/03/2019 since he was in [place]. 05/30/2019 he probably get another Taxotere at that time. Per note patient had MRI brain 05/23/2019 which showed no acute  intracranial process.  Mild generalized volume loss.  Mild to moderate sequela of chronic small vessel ischemic disease.  No evidence of intracranial metastatic disease.    INTERVAL HISTORY Tyler Stevenson is a 68 y.o. male who has above history reviewed by me today presents for follow up visit for management of metastatic prostate cancer  Problems and complaints are listed below: #Prostate cancer, patient takes Abiateron 750 mg with prednisone 5 mg daily. So far tolerates well. Denies any pain today.  Continue to have intermittent hot flashes. Reports feeling chronically fatigued. #Depression, patient not taking Lexapro.  Reports her mood is stable.   #weak urinary stream and frequent urination at night.  Patient takes doxazosin 2 mg daily.   Review of Systems  Constitutional: Positive for fatigue. Negative for appetite change, chills, fever and unexpected weight change.  HENT:   Negative for hearing loss and voice change.   Eyes: Negative for eye problems and icterus.  Respiratory: Negative for chest tightness, cough and shortness of breath.   Cardiovascular: Negative for chest pain and leg swelling.  Gastrointestinal: Negative for abdominal distention and abdominal pain.  Endocrine: Negative for hot flashes.  Genitourinary: Negative for difficulty urinating, dysuria and frequency.        Weak urine stream  Musculoskeletal: Negative for arthralgias.  Skin: Negative for itching and rash.  Neurological: Negative for light-headedness and numbness.  Hematological: Negative for adenopathy. Does not bruise/bleed easily.  Psychiatric/Behavioral: Negative for confusion and depression. The patient is not nervous/anxious.     MEDICAL HISTORY:  Past Medical History:  Diagnosis Date  . Prostate cancer [facility])     SURGICAL  HISTORY: Past Surgical History:  Procedure Laterality Date  . ANAL FISSURE REPAIR    . HEMORROIDECTOMY      SOCIAL HISTORY: Social History   Socioeconomic  History  . Marital status: Divorced    Spouse name: Not on file  . Number of children: Not on file  . Years of education: Not on file  . Highest education level: Not on file  Occupational History  . Not on file  Social Needs  . Financial resource strain: Not on file  . Food insecurity    Worry: Not on file    Inability: Not on file  . Transportation needs    Medical: Not on file    Non-medical: Not on file  Tobacco Use  . Smoking status: Never Smoker  . Smokeless tobacco: Never Used  Substance and Sexual Activity  . Alcohol use: Never    Frequency: Never  . Drug use: Never  . Sexual activity: Not Currently  Lifestyle  . Physical activity    Days per week: Not on file    Minutes per session: Not on file  . Stress: Not on file  Relationships  . Social Herbalist on phone: Not on file    Gets together: Not on file    Attends religious service: Not on file    Active member of club or organization: Not on file    Attends meetings of clubs or organizations: Not on file    Relationship status: Not on file  . Intimate partner violence    Fear of current or ex partner: Not on file    Emotionally abused: Not on file    Physically abused: Not on file    Forced sexual activity: Not on file  Other Topics Concern  . Not on file  Social History Narrative  . Not on file    FAMILY HISTORY: Family History  Problem Relation Age of Onset  . Skin cancer Father   . Breast cancer Sister   . Prostate cancer Paternal Grandfather     ALLERGIES:  is allergic to histamine.  MEDICATIONS:  Current Outpatient Medications  Medication Sig Dispense Refill  . abiraterone acetate (ZYTIGA) 250 MG tablet Take 4 tablets (1,000 mg total) by mouth daily. Take on an empty stomach 1 hour before or 2 hours after a meal 120 tablet 0  . acetaminophen (TYLENOL 8 HOUR) 650 MG CR tablet Take 650 mg by mouth every 8 (eight) hours as needed for pain.    . Ascorbic Acid (VITAMIN C) 1000 MG  tablet Take 1,000 mg by mouth daily. Vitamin c Crystals with Calcium 110mg .    . Biotin 1000 MCG tablet Take 1,000 mcg by mouth 2 (two) times daily.    Marland Kitchen doxazosin (CARDURA) 2 MG tablet Take 1 tablet (2 mg total) by mouth daily. 60 tablet 0  . ergocalciferol (VITAMIN D2) 1.25 MG (50000 UT) capsule Take 5,000 Units by mouth 2 (two) times daily.    . Magnesium 300 MG CAPS Take by mouth 2 (two) times daily.    . predniSONE (DELTASONE) 5 MG tablet Take 1 tablet (5 mg total) by mouth 2 (two) times daily. 60 tablet 1  . acetaminophen-codeine (TYLENOL #3) 300-30 MG tablet Take 1 tablet by mouth every 12 (twelve) hours as needed for moderate pain or severe pain. (Patient not taking: Reported on 08/29/2019) 30 tablet 0  . escitalopram (LEXAPRO) 10 MG tablet Take 1 tablet (10 mg total) by mouth daily. (Patient not  taking: Reported on 09/28/2019) 30 tablet 0   No current facility-administered medications for this visit.      PHYSICAL EXAMINATION: ECOG PERFORMANCE STATUS: 1 - Symptomatic but completely ambulatory Vitals:   09/28/19 1306 09/28/19 1317  BP: (!) 158/105 135/90  Pulse: 70   Resp: 16   Temp: (!) 96 F (35.6 C)   SpO2: 100%    Filed Weights   09/28/19 1306  Weight: 184 lb 4.8 oz (83.6 kg)    Physical Exam Constitutional:      General: He is not in acute distress. HENT:     Head: Normocephalic and atraumatic.  Eyes:     General: No scleral icterus.    Pupils: Pupils are equal, round, and reactive to light.  Neck:     Musculoskeletal: Normal range of motion and neck supple.  Cardiovascular:     Rate and Rhythm: Normal rate and regular rhythm.     Heart sounds: Normal heart sounds.  Pulmonary:     Effort: Pulmonary effort is normal. No respiratory distress.     Breath sounds: No wheezing.  Abdominal:     General: Bowel sounds are normal. There is no distension.     Palpations: Abdomen is soft. There is no mass.     Tenderness: There is no abdominal tenderness.   Musculoskeletal: Normal range of motion.        General: No deformity.  Skin:    General: Skin is warm and dry.     Findings: No erythema or rash.  Neurological:     Mental Status: He is alert and oriented to person, place, and time.     Cranial Nerves: No cranial nerve deficit.     Coordination: Coordination normal.  Psychiatric:        Mood and Affect: Mood normal.        Behavior: Behavior normal.        Thought Content: Thought content normal.     Comments:  Denies any suicidal ideation      LABORATORY DATA:  I have reviewed the data as listed Lab Results  Component Value Date   WBC 4.6 09/28/2019   HGB 11.5 (L) 09/28/2019   HCT 35.9 (L) 09/28/2019   MCV 89.5 09/28/2019   PLT 152 09/28/2019   Recent Labs    07/03/19 1202 08/27/19 1300 09/28/19 1238  NA 142 141 138  K 4.6 3.9 4.7  CL 107 109 109  CO2 27 25 23   GLUCOSE 103* 98 111*  BUN 37* 22 30*  CREATININE 1.38* 1.39* 1.20  CALCIUM 9.4 8.0* 8.2*  GFRNONAA 51* 51* >60  GFRAA 60* 59* >60  PROT 6.9 6.1* 6.5  ALBUMIN 3.9 3.8 4.1  AST 195* 11* 14*  ALT 113* 9 11  ALKPHOS 254* 449* 310*  BILITOT 0.7 0.6 0.6   Iron/TIBC/Ferritin/ %Sat    Component Value Date/Time   IRON 53 07/06/2019 0958   TIBC 267 07/06/2019 0958   FERRITIN 880 (H) 07/06/2019 0958   IRONPCTSAT 20 07/06/2019 0958      RADIOGRAPHIC STUDIES: I have personally reviewed the radiological images as listed and agreed with the findings in the report. Pcv Lower Venous US (bilateral)  Result Date: 09/08/2019 Lower Extremity Venous Duplex  Left leg 09/05/2019: No evidence of deep vein thrombosis of the left lower extremity with normal venous return. Generalized tissue edema noted.      ASSESSMENT & PLAN:  1. Prostate cancer metastatic to bone ([facility])   2. Encounter for  antineoplastic chemotherapy   3. Weak urine stream   4. Port-A-Cath in place   5. 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    Currently on Zytiga 750mg  daily and prednisone 5mg  daily.  Labs reviewed and discussed with patient. PSA continue to trend down to 10.12. Continue ADT.  Proceed with [place] Koller today. He has previously received docetaxel 3-4 cycles at previous oncologist office.  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  Anemia hemoglobin 11.5, improving.  Continue to monitor. Transaminitis, improved.  Increased alkaline phosphatase likely due to bone metastasis. Check GGT at the next visit. .  # Port A cath, continue port flush every 6- 8 week.  #Depression, mood is better today.  He is not taking Lexapro.  Prefers not to take anything at this point. #weak urine stream, BPH symptoms, continue doxazosin 2mg  daily.    Orders Placed This Encounter  Procedures  . CBC with Differential    Standing Status:   Future    Standing Expiration Date:   09/27/2020  . Comprehensive metabolic panel    Standing Status:   Future    Standing Expiration Date:   09/27/2020  . PSA    Standing Status:   Future    Standing Expiration Date:   09/27/2020    All questions were answered. The patient knows to call the clinic with any problems questions or concerns.  cc Bakare, Mobolaji B, MD   Return of visit: 4 weeks.   Earlie Server, MD, PhD Hematology Oncology [facility] Pager- [phone] 09/30/2019

## 2019-10-01 ENCOUNTER — Telehealth: Payer: Self-pay

## 2019-10-01 NOTE — Telephone Encounter (Signed)
-----   Message from Earlie Server, MD sent at 09/30/2019  1:00 PM EST ----- Please schedule patient to have port flush every 8 weeks x 6  last one was done on 09/28/2019.  thank you

## 2019-10-02 NOTE — Telephone Encounter (Signed)
Port flushes have been scheduled by Ellison Hughs, as requested.

## 2019-10-04 ENCOUNTER — Other Ambulatory Visit: Payer: Self-pay | Admitting: Oncology

## 2019-10-04 DIAGNOSIS — C7951 Secondary malignant neoplasm of bone: Secondary | ICD-10-CM

## 2019-10-04 DIAGNOSIS — C61 Malignant neoplasm of prostate: Secondary | ICD-10-CM

## 2019-10-04 NOTE — Telephone Encounter (Signed)
I don't work with Tasia Catchings

## 2019-10-04 NOTE — Telephone Encounter (Signed)
CBC with Differential/Platelet Order: [id number] Status:  Final result  Visible to patient:  No (scheduled for 10/04/2019 12:51 PM)  Next appt:  10/11/2019 at 11:00 AM in Oncology Eyvonne Mechanic)  Dx:  Encounter for antineoplastic chemothe...  Ref Range & Units 6 d ago  WBC 4.0 - 10.5 K/uL 4.6   RBC 4.22 - 5.81 MIL/uL 4.01Low    Hemoglobin 13.0 - 17.0 g/dL 11.5Low    HCT 39.0 - 52.0 % 35.9Low    MCV 80.0 - 100.0 fL 89.5   MCH 26.0 - 34.0 pg 28.7   MCHC 30.0 - 36.0 g/dL 32.0   RDW 11.5 - 15.5 % 16.7High    Platelets 150 - 400 K/uL 152   nRBC 0.0 - 0.2 % 0.0   Neutrophils Relative % % 54   Neutro Abs 1.7 - 7.7 K/uL 2.5   Lymphocytes Relative % 37   Lymphs Abs 0.7 - 4.0 K/uL 1.7   Monocytes Relative % 8   Monocytes Absolute 0.1 - 1.0 K/uL 0.4   Eosinophils Relative % 0   Eosinophils Absolute 0.0 - 0.5 K/uL 0.0   Basophils Relative % 0   Basophils Absolute 0.0 - 0.1 K/uL 0.0   Immature Granulocytes % 1   Abs Immature Granulocytes 0.00 - 0.07 K/uL 0.03   Comment: Performed at [facility], [address], [place], [place] [address]  Resulting Agency  [facility] LAB      Specimen Collected: 09/28/19 12:38  Last Resulted: 09/28/19 12:51       Comprehensive metabolic panel Order: [id number] Status:  Final result  Visible to patient:  No (scheduled for 10/04/2019 1:05 PM)  Next appt:  10/11/2019 at 11:00 AM in Oncology [facility] Mechanic)  Dx:  Encounter for antineoplastic chemothe...  Ref Range & Units 6 d ago  Sodium 135 - 145 mmol/L 138   Potassium 3.5 - 5.1 mmol/L 4.7   Chloride 98 - 111 mmol/L 109   CO2 22 - 32 mmol/L 23   Glucose, Bld 70 - 99 mg/dL 111High    BUN 8 - 23 mg/dL 30High    Creatinine, Ser 0.61 - 1.24 mg/dL 1.20   Calcium 8.9 - 10.3 mg/dL 8.2Low    Total Protein 6.5 - 8.1 g/dL 6.5   Albumin 3.5 - 5.0 g/dL 4.1   AST 15 - 41 U/L 14Low    ALT 0 - 44 U/L 11   Alkaline Phosphatase 38 - 126 U/L 310High    Total Bilirubin 0.3 - 1.2 mg/dL 0.6   GFR calc  non Af Amer >60 mL/min >60   GFR calc Af Amer >60 mL/min >60   Anion gap 5 - 15 6   Comment: Performed at [facility], [address] [place], [place] [address]  Resulting Agency  [facility] LAB      Specimen Collected: 09/28/19 12:38  Last Resulted: 09/28/19 13:05       PSA  Status:  Final result  Visible to patient:  No (scheduled for 10/04/2019 9:04 PM)  Next appt:  10/11/2019 at 11:00 AM in Oncology [facility] Mechanic)  Dx:  Encounter for antineoplastic chemothe... Order: [id number]  Ref Range & Units 6 d ago  Prostatic Specific Antigen 0.00 - 4.00 ng/mL 10.12High    Comment: (NOTE)  While PSA levels of <=4.0 ng/ml are reported as reference range, some  men with levels below 4.0 ng/ml can have prostate cancer and many men  with PSA above 4.0 ng/ml do not  have prostate cancer. Other tests  such as free PSA, age specific reference ranges, PSA velocity and PSA  doubling time may be helpful especially in men less than 46 years  old.  Performed at [facility], [place] [address], [place], [place]  [address]   Resulting Agency  [facility] LAB      Specimen Collected: 09/28/19 12:38  Last Resulted: 09/28/19 21:03

## 2019-10-05 MED FILL — ABIRATERONE ACETATE 250 MG: 250 | 30 days supply | Qty: 120 | Fill #0

## 2019-10-10 ENCOUNTER — Other Ambulatory Visit: Payer: Self-pay

## 2019-10-11 ENCOUNTER — Inpatient Hospital Stay (HOSPITAL_BASED_OUTPATIENT_CLINIC_OR_DEPARTMENT_OTHER): Payer: Medicare HMO | Admitting: Licensed Clinical Social Worker

## 2019-10-11 ENCOUNTER — Encounter: Payer: Self-pay | Admitting: Licensed Clinical Social Worker

## 2019-10-11 ENCOUNTER — Inpatient Hospital Stay: Payer: Medicare HMO

## 2019-10-11 ENCOUNTER — Other Ambulatory Visit: Payer: Self-pay

## 2019-10-11 DIAGNOSIS — Z803 Family history of malignant neoplasm of breast: Secondary | ICD-10-CM

## 2019-10-11 DIAGNOSIS — Z1379 Encounter for other screening for genetic and chromosomal anomalies: Secondary | ICD-10-CM | POA: Diagnosis not present

## 2019-10-11 DIAGNOSIS — C7951 Secondary malignant neoplasm of bone: Secondary | ICD-10-CM

## 2019-10-11 DIAGNOSIS — Z8042 Family history of malignant neoplasm of prostate: Secondary | ICD-10-CM | POA: Insufficient documentation

## 2019-10-11 DIAGNOSIS — C61 Malignant neoplasm of prostate: Secondary | ICD-10-CM | POA: Diagnosis not present

## 2019-10-11 DIAGNOSIS — Z808 Family history of malignant neoplasm of other organs or systems: Secondary | ICD-10-CM

## 2019-10-11 NOTE — Progress Notes (Signed)
REFERRING PROVIDER: Earlie Server, MD [place],  [place] [address]  PRIMARY PROVIDER:  Audley Hose, MD  PRIMARY REASON FOR VISIT:  1. Prostate cancer metastatic to bone ([place])   2. Family history of breast cancer   3. Family history of prostate cancer   4. Family history of skin cancer    I connected with Tyler Stevenson on 10/11/2019 at 11:00 AM EDT by Jackquline Denmark and verified that I am speaking with the correct person using two identifiers.    Patient location: [place] Provider location: [facility]   HISTORY OF PRESENT ILLNESS:   Tyler Stevenson, a 68 y.o. male, was seen for a [facility] cancer genetics consultation at the request of Dr. Tasia Catchings due to a personal and family history of cancer.  Mr. Schumpert presents to clinic today to discuss the possibility of a hereditary predisposition to cancer, genetic testing, and to further clarify his future cancer risks, as well as potential cancer risks for family members.   At the age of 65, Tyler Stevenson was diagnosed with prostate cancer in [place] and treated in [place] and [place], and here at [facility]. He has had radiation therapy and hormone treatment. This cancer is metastatic to his bones.   CANCER HISTORY:  Oncology History   No history exists.     Past Medical History:  Diagnosis Date  . Family history of breast cancer   . Family history of prostate cancer   . Family history of skin cancer   . Prostate cancer [facility])     Past Surgical History:  Procedure Laterality Date  . ANAL FISSURE REPAIR    . HEMORROIDECTOMY      Social History   Socioeconomic History  . Marital status: Divorced    Spouse name: Not on file  . Number of children: Not on file  . Years of education: Not on file  . Highest education level: Not on file  Occupational History  . Not on file  Tobacco Use  . Smoking status: Never Smoker  . Smokeless tobacco: Never Used  Substance and Sexual Activity  . Alcohol use:  Never  . Drug use: Never  . Sexual activity: Not Currently  Other Topics Concern  . Not on file  Social History Narrative  . Not on file   Social Determinants of Health   Financial Resource Strain:   . Difficulty of Paying Living Expenses: Not on file  Food Insecurity:   . Worried About Charity fundraiser in the Last Year: Not on file  . Ran Out of Food in the Last Year: Not on file  Transportation Needs:   . Lack of Transportation (Medical): Not on file  . Lack of Transportation (Non-Medical): Not on file  Physical Activity:   . Days of Exercise per Week: Not on file  . Minutes of Exercise per Session: Not on file  Stress:   . Feeling of Stress : Not on file  Social Connections:   . Frequency of Communication with Friends and Family: Not on file  . Frequency of Social Gatherings with Friends and Family: Not on file  . Attends Religious Services: Not on file  . Active Member of Clubs or Organizations: Not on file  . Attends Archivist Meetings: Not on file  . Marital Status: Not on file     FAMILY HISTORY:  We obtained a detailed, 4-generation family history.  Significant diagnoses are listed below: Family History  Problem Relation Age of Onset  . Skin cancer Father        metastasized to brain  . Breast cancer Sister 43  . Prostate cancer Paternal Grandfather 11  . Throat cancer Brother 30  . Prostate cancer Maternal Grandfather 3   Tyler Stevenson has 2 sons, [place] and [place], and an adopted daughter Ronda Fairly. No cancers. Patient had 1 full brother who died at 36 and had history of throat cancer at 42, and 1 full sister who had breast cancer at 14 and is living at 62. Patient also had 4 paternal half brothers, all deceased and no cancer history, and 1 paternal half sister who is living with no history of cancer.  Tyler Stevenson' mother passed at 55, no cancer. Patient had 3 maternal aunts and 1 maternal uncle. One aunt had cancer but he is unsure the type. No cancers  in maternal cousins. Maternal grandmother died at 60 due to aneurysm. Maternal grandfather had prostate cancer and died at 47. Patient also notes that his grandfather had exposure to DDT.  Tyler Stevenson' father died at 15. He had skin cancer that started on his face and metastasized to his brain. Patient's father was an only child. Paternal grandmother died at 6, grandfather had prostate cancer and died around age 35, also had exposure to DDT.  Tyler Stevenson is unaware of previous family history of genetic testing for hereditary cancer risks. There is no reported Ashkenazi Jewish ancestry. There is no known consanguinity.  GENETIC COUNSELING ASSESSMENT: Mr. Brow is a 68 y.o. male with a personal and family history which is somewhat suggestive of a hereditary cancer syndrome and predisposition to cancer. We, therefore, discussed and recommended the following at today's visit.   DISCUSSION: We discussed that 5 - 10% of prostate cancer is hereditary, with most cases associated with BRCA1/BRCA2.  There are other genes that can be associated with hereditary prostate and breast cancer syndromes.  We discussed that testing is beneficial for several reasons including knowing how to follow individuals after completing their treatment, and understand if other family members could be at risk for cancer and allow them to undergo genetic testing.   We reviewed the characteristics, features and inheritance patterns of hereditary cancer syndromes. We also discussed genetic testing, including the appropriate family members to test, the process of testing, insurance coverage and turn-around-time for results. We discussed the implications of a negative, positive and/or variant of uncertain significant result. We recommended Tyler Stevenson pursue genetic testing for the [facility] Common Hereditary Cancers gene panel.   The Common Hereditary Cancers Panel offered by [facility] includes sequencing and/or deletion duplication testing  of the following 48 genes: APC, ATM, AXIN2, BARD1, BMPR1A, BRCA1, BRCA2, BRIP1, CDH1, CDKN2A (p14ARF), CDKN2A (p16INK4a), CKD4, CHEK2, CTNNA1, DICER1, EPCAM (Deletion/duplication testing only), GREM1 (promoter region deletion/duplication testing only), KIT, MEN1, MLH1, MSH2, MSH3, MSH6, MUTYH, NBN, NF1, NHTL1, PALB2, PDGFRA, PMS2, POLD1, POLE, PTEN, RAD50, RAD51C, RAD51D, RNF43, SDHB, SDHC, SDHD, SMAD4, SMARCA4. STK11, TP53, TSC1, TSC2, and VHL.  The following genes were evaluated for sequence changes only: SDHA and HOXB13 c.251G>A variant only.  Based on Mr. Warzecha personal and family history of cancer, he meets medical criteria for genetic testing. Despite that he meets criteria, he may still have an out of pocket cost.   PLAN: After considering the risks, benefits, and limitations, Mr. Fellers provided informed consent to pursue genetic testing and the blood sample was sent to [facility] for analysis of the Common Hereditary Cancers. Results  should be available within approximately 2-3 weeks' time, at which point they will be disclosed by telephone to Mr. Hennessee, as will any additional recommendations warranted by these results. Mr. Simonetti will receive a summary of his genetic counseling visit and a copy of his results once available. This information will also be available in Epic.   Mr. Walston questions were answered to his satisfaction today. Our contact information was provided should additional questions or concerns arise. Thank you for the referral and allowing [place] to share in the care of your patient.   Faith Rogue, MS, [facility] Genetic Counselor [email or web address] Phone: [phone]  The patient was seen for a total of 35 minutes in face-to-face genetic counseling.  Drs. Magrinat, Lindi Adie and/or Burr Medico were available for discussion regarding this case.   _______________________________________________________________________ For Office Staff:  Number of people  involved in session: 1 Was an Intern/ student involved with case: no

## 2019-10-23 ENCOUNTER — Encounter: Payer: Self-pay | Admitting: Licensed Clinical Social Worker

## 2019-10-23 ENCOUNTER — Telehealth: Payer: Self-pay | Admitting: Licensed Clinical Social Worker

## 2019-10-23 ENCOUNTER — Ambulatory Visit: Payer: Self-pay | Admitting: Licensed Clinical Social Worker

## 2019-10-23 DIAGNOSIS — Z1379 Encounter for other screening for genetic and chromosomal anomalies: Secondary | ICD-10-CM | POA: Insufficient documentation

## 2019-10-23 DIAGNOSIS — Z8042 Family history of malignant neoplasm of prostate: Secondary | ICD-10-CM

## 2019-10-23 DIAGNOSIS — C61 Malignant neoplasm of prostate: Secondary | ICD-10-CM

## 2019-10-23 DIAGNOSIS — Z808 Family history of malignant neoplasm of other organs or systems: Secondary | ICD-10-CM

## 2019-10-23 DIAGNOSIS — Z803 Family history of malignant neoplasm of breast: Secondary | ICD-10-CM

## 2019-10-23 NOTE — Telephone Encounter (Signed)
Revealed negative genetic testing.This normal result is reassuring and indicates that it is unlikely Tyler Stevenson's cancer is due to a hereditary cause.  It is unlikely that there is an increased risk of another cancer due to a mutation in one of these genes.  However, genetic testing is not perfect, and cannot definitively rule out a hereditary cause.  It will be important for him to keep in contact with genetics to learn if any additional testing may be needed in the future.

## 2019-10-23 NOTE — Progress Notes (Signed)
HPI:  Mr. Day was previously seen in the [place] clinic due to a personal and family history of cancer and concerns regarding a hereditary predisposition to cancer. Please refer to our prior cancer genetics clinic note for more information regarding our discussion, assessment and recommendations, at the time. Mr. Imran recent genetic test results were disclosed to him, as were recommendations warranted by these results. These results and recommendations are discussed in more detail below.  CANCER HISTORY:  Oncology History   No history exists.    FAMILY HISTORY:  We obtained a detailed, 4-generation family history.  Significant diagnoses are listed below: Family History  Problem Relation Age of Onset  . Skin cancer Father        metastasized to brain  . Breast cancer Sister 78  . Prostate cancer Paternal Grandfather 59  . Throat cancer Brother 30  . Prostate cancer Maternal Grandfather 74    Mr. Yu has 2 sons, [place] and [place], and an adopted daughter Ronda Fairly. No cancers. Patient had 1 full brother who died at 21 and had history of throat cancer at 20, and 1 full sister who had breast cancer at 38 and is living at 38. Patient also had 4 paternal half brothers, all deceased and no cancer history, and 1 paternal half sister who is living with no history of cancer.  Mr. Blahnik' mother passed at 49, no cancer. Patient had 3 maternal aunts and 1 maternal uncle. One aunt had cancer but he is unsure the type. No cancers in maternal cousins. Maternal grandmother died at 29 due to aneurysm. Maternal grandfather had prostate cancer and died at 40. Patient also notes that his grandfather had exposure to DDT.  Mr. Pipkins' father died at 29. He had skin cancer that started on his face and metastasized to his brain. Patient's father was an only child. Paternal grandmother died at 82, grandfather had prostate cancer and died around age 70, also had exposure to DDT.  Mr.  Sitzman is unaware of previous family history of genetic testing for hereditary cancer risks. There is no reported Ashkenazi Jewish ancestry. There is no known consanguinity.  GENETIC TEST RESULTS: Genetic testing reported out on 10/18/2019 through the [facility] Common Hereditary cancer panel found no pathogenic mutations. The Common Hereditary Cancers Panel offered by [facility] includes sequencing and/or deletion duplication testing of the following 48 genes: APC, ATM, AXIN2, BARD1, BMPR1A, BRCA1, BRCA2, BRIP1, CDH1, CDKN2A (p14ARF), CDKN2A (p16INK4a), CKD4, CHEK2, CTNNA1, DICER1, EPCAM (Deletion/duplication testing only), GREM1 (promoter region deletion/duplication testing only), KIT, MEN1, MLH1, MSH2, MSH3, MSH6, MUTYH, NBN, NF1, NHTL1, PALB2, PDGFRA, PMS2, POLD1, POLE, PTEN, RAD50, RAD51C, RAD51D, RNF43, SDHB, SDHC, SDHD, SMAD4, SMARCA4. STK11, TP53, TSC1, TSC2, and VHL.  The following genes were evaluated for sequence changes only: SDHA and HOXB13 c.251G>A variant only. The test report has been scanned into EPIC and is located under the Molecular Pathology section of the Results Review tab.  A portion of the result report is included below for reference.     We discussed with Mr. Dykman that because current genetic testing is not perfect, it is possible there may be a gene mutation in one of these genes that current testing cannot detect, but that chance is small.  We also discussed, that there could be another gene that has not yet been discovered, or that we have not yet tested, that is responsible for the cancer diagnoses in the family. It is also possible there is a hereditary cause for  the cancer in the family that Mr. Yandow did not inherit and therefore was not identified in his testing.  Therefore, it is important to remain in touch with cancer genetics in the future so that we can continue to offer Mr. Trupiano the most up to date genetic testing.   ADDITIONAL GENETIC TESTING: We discussed with  Mr. Maners that his genetic testing was fairly extensive.  If there are genes identified to increase cancer risk that can be analyzed in the future, we would be happy to discuss and coordinate this testing at that time.    CANCER SCREENING RECOMMENDATIONS: Mr. Griffie test result is considered negative (normal).  This means that we have not identified a hereditary cause for his  personal and family history of cancer at this time. Most cancers happen by chance and this negative test suggests that his cancer may fall into this category.    While reassuring, this does not definitively rule out a hereditary predisposition to cancer. It is still possible that there could be genetic mutations that are undetectable by current technology. There could be genetic mutations in genes that have not been tested or identified to increase cancer risk.  Therefore, it is recommended he continue to follow the cancer management and screening guidelines provided by his oncology and primary healthcare provider.   An individual's cancer risk and medical management are not determined by genetic test results alone. Overall cancer risk assessment incorporates additional factors, including personal medical history, family history, and any available genetic information that may result in a personalized plan for cancer prevention and surveillance.  RECOMMENDATIONS FOR FAMILY MEMBERS:  Relatives in this family might be at some increased risk of developing cancer, over the general population risk, simply due to the family history of cancer.  We recommended male relatives in this family have a yearly mammogram beginning at age 4, or 79 years younger than the earliest onset of cancer, an annual clinical breast exam, and perform monthly breast self-exams. Male relatives in this family should also have a gynecological exam as recommended by their primary provider. All family members should have a colonoscopy by age 68, or as directed  by their physicians.  FOLLOW-UP: Lastly, we discussed with Mr. Dimmick that cancer genetics is a rapidly advancing field and it is possible that new genetic tests will be appropriate for him and/or his family members in the future. We encouraged him to remain in contact with cancer genetics on an annual basis so we can update his personal and family histories and let him know of advances in cancer genetics that may benefit this family.   Our contact number was provided. Mr. Laviolette questions were answered to his satisfaction, and he knows he is welcome to call us at anytime with additional questions or concerns.   Faith Rogue, MS, [facility] Genetic Counselor [email or web address] Phone: [phone]

## 2019-10-26 ENCOUNTER — Other Ambulatory Visit: Payer: Self-pay | Admitting: Oncology

## 2019-10-29 ENCOUNTER — Inpatient Hospital Stay: Payer: Medicare HMO

## 2019-10-29 ENCOUNTER — Inpatient Hospital Stay (HOSPITAL_BASED_OUTPATIENT_CLINIC_OR_DEPARTMENT_OTHER): Payer: Medicare HMO | Admitting: Oncology

## 2019-10-29 ENCOUNTER — Inpatient Hospital Stay: Payer: Medicare HMO | Attending: Oncology | Admitting: *Deleted

## 2019-10-29 ENCOUNTER — Other Ambulatory Visit: Payer: Self-pay

## 2019-10-29 ENCOUNTER — Encounter: Payer: Self-pay | Admitting: Oncology

## 2019-10-29 VITALS — BP 149/96 | HR 63 | Temp 96.8°F | Resp 18 | Wt 184.7 lb

## 2019-10-29 DIAGNOSIS — R3912 Poor urinary stream: Secondary | ICD-10-CM | POA: Diagnosis not present

## 2019-10-29 DIAGNOSIS — R5383 Other fatigue: Secondary | ICD-10-CM | POA: Diagnosis not present

## 2019-10-29 DIAGNOSIS — G629 Polyneuropathy, unspecified: Secondary | ICD-10-CM | POA: Insufficient documentation

## 2019-10-29 DIAGNOSIS — Z5111 Encounter for antineoplastic chemotherapy: Secondary | ICD-10-CM | POA: Diagnosis not present

## 2019-10-29 DIAGNOSIS — D649 Anemia, unspecified: Secondary | ICD-10-CM

## 2019-10-29 DIAGNOSIS — Z95828 Presence of other vascular implants and grafts: Secondary | ICD-10-CM

## 2019-10-29 DIAGNOSIS — K5900 Constipation, unspecified: Secondary | ICD-10-CM | POA: Insufficient documentation

## 2019-10-29 DIAGNOSIS — Z1379 Encounter for other screening for genetic and chromosomal anomalies: Secondary | ICD-10-CM

## 2019-10-29 DIAGNOSIS — C61 Malignant neoplasm of prostate: Secondary | ICD-10-CM

## 2019-10-29 DIAGNOSIS — F329 Major depressive disorder, single episode, unspecified: Secondary | ICD-10-CM | POA: Diagnosis not present

## 2019-10-29 DIAGNOSIS — Z79899 Other long term (current) drug therapy: Secondary | ICD-10-CM | POA: Diagnosis not present

## 2019-10-29 DIAGNOSIS — Z79818 Long term (current) use of other agents affecting estrogen receptors and estrogen levels: Secondary | ICD-10-CM | POA: Insufficient documentation

## 2019-10-29 DIAGNOSIS — C7951 Secondary malignant neoplasm of bone: Secondary | ICD-10-CM | POA: Diagnosis not present

## 2019-10-29 DIAGNOSIS — Z7952 Long term (current) use of systemic steroids: Secondary | ICD-10-CM | POA: Insufficient documentation

## 2019-10-29 DIAGNOSIS — R232 Flushing: Secondary | ICD-10-CM | POA: Insufficient documentation

## 2019-10-29 LAB — COMPREHENSIVE METABOLIC PANEL
ALT: 15 U/L (ref 0–44)
AST: 16 U/L (ref 15–41)
Albumin: 3.9 g/dL (ref 3.5–5.0)
Alkaline Phosphatase: 201 U/L — ABNORMAL HIGH (ref 38–126)
Anion gap: 6 (ref 5–15)
BUN: 20 mg/dL (ref 8–23)
CO2: 25 mmol/L (ref 22–32)
Calcium: 8 mg/dL — ABNORMAL LOW (ref 8.9–10.3)
Chloride: 110 mmol/L (ref 98–111)
Creatinine, Ser: 1.7 mg/dL — ABNORMAL HIGH (ref 0.61–1.24)
GFR calc Af Amer: 46 mL/min — ABNORMAL LOW (ref >60)
GFR calc non Af Amer: 40 mL/min — ABNORMAL LOW (ref >60)
Glucose, Bld: 107 mg/dL — ABNORMAL HIGH (ref 70–99)
Potassium: 3.7 mmol/L (ref 3.5–5.1)
Sodium: 141 mmol/L (ref 135–145)
Total Bilirubin: 0.7 mg/dL (ref 0.3–1.2)
Total Protein: 6.4 g/dL — ABNORMAL LOW (ref 6.5–8.1)

## 2019-10-29 LAB — CBC WITH DIFFERENTIAL/PLATELET
Abs Immature Granulocytes: 0.02 10*3/uL (ref 0.00–0.07)
Basophils Absolute: 0 10*3/uL (ref 0.0–0.1)
Basophils Relative: 0 %
Eosinophils Absolute: 0 10*3/uL (ref 0.0–0.5)
Eosinophils Relative: 1 %
HCT: 35.2 % — ABNORMAL LOW (ref 39.0–52.0)
Hemoglobin: 11.5 g/dL — ABNORMAL LOW (ref 13.0–17.0)
Immature Granulocytes: 1 %
Lymphocytes Relative: 50 %
Lymphs Abs: 1.8 10*3/uL (ref 0.7–4.0)
MCH: 28.3 pg (ref 26.0–34.0)
MCHC: 32.7 g/dL (ref 30.0–36.0)
MCV: 86.5 fL (ref 80.0–100.0)
Monocytes Absolute: 0.4 10*3/uL (ref 0.1–1.0)
Monocytes Relative: 10 %
Neutro Abs: 1.4 10*3/uL — ABNORMAL LOW (ref 1.7–7.7)
Neutrophils Relative %: 38 %
Platelets: 175 10*3/uL (ref 150–400)
RBC: 4.07 MIL/uL — ABNORMAL LOW (ref 4.22–5.81)
RDW: 15.3 % (ref 11.5–15.5)
WBC: 3.7 10*3/uL — ABNORMAL LOW (ref 4.0–10.5)
nRBC: 0 % (ref 0.0–0.2)

## 2019-10-29 LAB — GAMMA GT: GGT: 22 U/L (ref 7–50)

## 2019-10-29 LAB — PSA: Prostatic Specific Antigen: 12.27 ng/mL — ABNORMAL HIGH (ref 0.00–4.00)

## 2019-10-29 MED ORDER — SODIUM CHLORIDE 0.9% FLUSH
10.0000 mL | Freq: Once | INTRAVENOUS | Status: AC
  Administered 2019-10-29: 10 mL via INTRAVENOUS
  Filled 2019-10-29: qty 10

## 2019-10-29 MED ORDER — DEGARELIX ACETATE 80 MG ~~LOC~~ SOLR
80.0000 mg | Freq: Once | SUBCUTANEOUS | Status: AC
  Administered 2019-10-29: 80 mg via SUBCUTANEOUS
  Filled 2019-10-29: qty 4

## 2019-10-29 MED ORDER — HEPARIN SOD (PORK) LOCK FLUSH 100 UNIT/ML IV SOLN
500.0000 [IU] | Freq: Once | INTRAVENOUS | Status: AC
  Administered 2019-10-29: 500 [IU] via INTRAVENOUS
  Filled 2019-10-29: qty 5

## 2019-10-29 MED ORDER — PREDNISONE 5 MG PO TABS: 5.0000 mg | ORAL_TABLET | Freq: Two times a day (BID) | ORAL | 1 refills | Status: AC

## 2019-10-29 NOTE — Progress Notes (Signed)
Hematology Oncology Progress Note.   REASON FOR VISIT Follow up for treatment of metastatic prostate cancer.   PERTINENT ONCOLOGY HISTORY Tyler Stevenson is a 69 y.o.[place] who has above oncology history reviewed by me today presented for follow up visit for management of prostate cancer  Patient reports a history of prostate cancer. Initially diagnosed in [place]. He was treated at [facility].  Reports that he was started on hormone blocker treatments-possibly Lupron and he did not tolerate that well.  Patient moved his care to [place] oncologist Patient moved to [facility] to live with his cousin.  Patient reports having difficulty initiating a stream and urinary stream has been weak. His medication list shows that he is on Casodex 50 mg daily, Zytiga 250 mg 4 tablets by mouth dailyand prednisone.  Also on Flomax and doxazosin. Patient reports that he has not been taking any medication for the past few weeks.  Cannot recall details of the timeframe 06/22/2019 Blood work doneWith primary care provider showed increased alkaline phosphatase 153, PSA at the level of 1004.  #  obtained patient's medical records from [facility]. Extensive medical record review was performed by me. Per [place] oncologist notes, Approximately June 2019 patient was started on Eligard. 05/22/2018, patient was on Eligard/Casodex combo, PSA dropped from 3210 down to 59.7.  Patient also was treated with Xgeva.  Per note, patient reports that he feels better with improved appetite, gained weight 09/28/2018 patient was continued on Eligard/Casodex along with Xgeva.  PSA has dropped to 1.14 in September 2019. 11/30/2018 patient is due on Eligard/Casodex/Xgeva, PSA was rising for the past 2 months. 12/21/2018 patient has had a rapid increase in the PSA was considered to become castration resistant.   And was switched to Zytiga plus prednisone.  Casodex was stopped. 01/30/2019 on Zytiga plus  prednisone.  He feels significant improvement in the bone pain since start of Zytiga. 02/27/2019 PSA was 150. 02/2019, PSA rose to 324 and decision was made to start Taxotere treatments. Port was placed in June 2020. It was not clear how many cycles of Taxotere that patient received. 04/24/2019 patient was responding to Taxotere with significant drop of the PSA to 128 patient experienced effects from Taxotere including fatigue, mild constipation.  05/17/2019 patient missed his chemo on 05/03/2019 since he was in [place]. 05/30/2019 he probably get another Taxotere at that time. Per note patient had MRI brain 05/23/2019 which showed no acute intracranial process.  Mild generalized volume loss.  Mild to moderate sequela of chronic small vessel ischemic disease.  No evidence of intracranial metastatic disease.  #Genetic testing: Patient has a special care with to that counselor on 10/11/2019.  Genetic testing found no pathologic mutations.    INTERVAL HISTORY Tyler Stevenson is a 69 y.o. male who has above history reviewed by me today presents for follow up visit for management of metastatic prostate cancer  Problems and complaints are listed below: #Prostate cancer, patient has been on Abiateron 750 mg with prednisone 5 mg daily. So far he tolerates well. Denies any pain today. He continues to have intermittent hot flashes. Chronic fatigue unchanged. Depression, patient is not taking Lexapro.  Reports that his mood is okay today.  Review of Systems  Constitutional: Positive for fatigue. Negative for appetite change, chills, fever and unexpected weight change.  HENT:   Negative for hearing loss and voice change.   Eyes: Negative for eye problems and icterus.  Respiratory: Negative for chest tightness, cough and shortness  of breath.   Cardiovascular: Negative for chest pain and leg swelling.  Gastrointestinal: Negative for abdominal distention and abdominal pain.  Endocrine: Negative for hot  flashes.  Genitourinary: Negative for difficulty urinating, dysuria and frequency.   Musculoskeletal: Negative for arthralgias.  Skin: Negative for itching and rash.  Neurological: Negative for light-headedness and numbness.  Hematological: Negative for adenopathy. Does not bruise/bleed easily.  Psychiatric/Behavioral: Negative for confusion and depression. The patient is not nervous/anxious.     MEDICAL HISTORY:  Past Medical History:  Diagnosis Date  . Family history of breast cancer   . Family history of prostate cancer   . Family history of skin cancer   . Prostate cancer [facility])     SURGICAL HISTORY: Past Surgical History:  Procedure Laterality Date  . ANAL FISSURE REPAIR    . HEMORROIDECTOMY      SOCIAL HISTORY: Social History   Socioeconomic History  . Marital status: Divorced    Spouse name: Not on file  . Number of children: Not on file  . Years of education: Not on file  . Highest education level: Not on file  Occupational History  . Not on file  Tobacco Use  . Smoking status: Never Smoker  . Smokeless tobacco: Never Used  Substance and Sexual Activity  . Alcohol use: Never  . Drug use: Never  . Sexual activity: Not Currently  Other Topics Concern  . Not on file  Social History Narrative  . Not on file   Social Determinants of Health   Financial Resource Strain:   . Difficulty of Paying Living Expenses: Not on file  Food Insecurity:   . Worried About Charity fundraiser in the Last Year: Not on file  . Ran Out of Food in the Last Year: Not on file  Transportation Needs:   . Lack of Transportation (Medical): Not on file  . Lack of Transportation (Non-Medical): Not on file  Physical Activity:   . Days of Exercise per Week: Not on file  . Minutes of Exercise per Session: Not on file  Stress:   . Feeling of Stress : Not on file  Social Connections:   . Frequency of Communication with Friends and Family: Not on file  . Frequency of Social Gatherings  with Friends and Family: Not on file  . Attends Religious Services: Not on file  . Active Member of Clubs or Organizations: Not on file  . Attends Archivist Meetings: Not on file  . Marital Status: Not on file  Intimate Partner Violence:   . Fear of Current or Ex-Partner: Not on file  . Emotionally Abused: Not on file  . Physically Abused: Not on file  . Sexually Abused: Not on file    FAMILY HISTORY: Family History  Problem Relation Age of Onset  . Skin cancer Father        metastasized to brain  . Breast cancer Sister 51  . Prostate cancer Paternal Grandfather 50  . Throat cancer Brother 30  . Prostate cancer Maternal Grandfather 50    ALLERGIES:  is allergic to histamine.  MEDICATIONS:  Current Outpatient Medications  Medication Sig Dispense Refill  . abiraterone acetate (ZYTIGA) 250 MG tablet TAKE 4 TABLETS (1,000 MG TOTAL) BY MOUTH DAILY. TAKE ON AN EMPTY STOMACH 1 HOUR BEFORE OR 2 HOURS AFTER A MEAL 120 tablet 0  . acetaminophen (TYLENOL 8 HOUR) 650 MG CR tablet Take 650 mg by mouth every 8 (eight) hours as needed for pain.    Marland Kitchen  APPLE CIDER VINEGAR PO Take by mouth.    . Ascorbic Acid (VITAMIN C) 1000 MG tablet Take 1,000 mg by mouth daily. Vitamin c Crystals with Calcium 110mg .    . Biotin 1000 MCG tablet Take 1,000 mcg by mouth 2 (two) times daily.    Marland Kitchen doxazosin (CARDURA) 2 MG tablet TAKE 1 TABLET BY MOUTH EVERY DAY 90 tablet 1  . ergocalciferol (VITAMIN D2) 1.25 MG (50000 UT) capsule Take 5,000 Units by mouth 2 (two) times daily.    . Garlic 123XX123 MG CAPS Take 1,000 mg by mouth 2 (two) times daily.    . Magnesium 300 MG CAPS Take by mouth 2 (two) times daily.    . predniSONE (DELTASONE) 5 MG tablet Take 1 tablet (5 mg total) by mouth 2 (two) times daily. 60 tablet 1  . acetaminophen-codeine (TYLENOL #3) 300-30 MG tablet Take 1 tablet by mouth every 12 (twelve) hours as needed for moderate pain or severe pain. (Patient not taking: Reported on 08/29/2019) 30  tablet 0  . escitalopram (LEXAPRO) 10 MG tablet Take 1 tablet (10 mg total) by mouth daily. (Patient not taking: Reported on 09/28/2019) 30 tablet 0   No current facility-administered medications for this visit.     PHYSICAL EXAMINATION: ECOG PERFORMANCE STATUS: 1 - Symptomatic but completely ambulatory Vitals:   10/29/19 0933  BP: (!) 149/96  Pulse: 63  Resp: 18  Temp: (!) 96.8 F (36 C)   Filed Weights   10/29/19 0933  Weight: 184 lb 11.2 oz (83.8 kg)    Physical Exam Constitutional:      General: He is not in acute distress. HENT:     Head: Normocephalic and atraumatic.  Eyes:     General: No scleral icterus.    Pupils: Pupils are equal, round, and reactive to light.  Cardiovascular:     Rate and Rhythm: Normal rate and regular rhythm.     Heart sounds: Normal heart sounds.  Pulmonary:     Effort: Pulmonary effort is normal. No respiratory distress.     Breath sounds: No wheezing.  Abdominal:     General: Bowel sounds are normal. There is no distension.     Palpations: Abdomen is soft. There is no mass.     Tenderness: There is no abdominal tenderness.  Musculoskeletal:        General: No deformity. Normal range of motion.     Cervical back: Normal range of motion and neck supple.  Skin:    General: Skin is warm and dry.     Findings: No erythema or rash.  Neurological:     Mental Status: He is alert and oriented to person, place, and time.     Cranial Nerves: No cranial nerve deficit.     Coordination: Coordination normal.  Psychiatric:        Mood and Affect: Mood normal.        Behavior: Behavior normal.        Thought Content: Thought content normal.     Comments:  Denies any suicidal ideation      LABORATORY DATA:  I have reviewed the data as listed Lab Results  Component Value Date   WBC 3.7 (L) 10/29/2019   HGB 11.5 (L) 10/29/2019   HCT 35.2 (L) 10/29/2019   MCV 86.5 10/29/2019   PLT 175 10/29/2019   Recent Labs    08/27/19 1300  09/28/19 1238 10/29/19 0910  NA 141 138 141  K 3.9 4.7 3.7  CL 109  109 110  CO2 25 23 25   GLUCOSE 98 111* 107*  BUN 22 30* 20  CREATININE 1.39* 1.20 1.70*  CALCIUM 8.0* 8.2* 8.0*  GFRNONAA 51* >60 40*  GFRAA 59* >60 46*  PROT 6.1* 6.5 6.4*  ALBUMIN 3.8 4.1 3.9  AST 11* 14* 16  ALT 9 11 15   ALKPHOS 449* 310* 201*  BILITOT 0.6 0.6 0.7   Iron/TIBC/Ferritin/ %Sat    Component Value Date/Time   IRON 53 07/06/2019 0958   TIBC 267 07/06/2019 0958   FERRITIN 880 (H) 07/06/2019 0958   IRONPCTSAT 20 07/06/2019 0958      RADIOGRAPHIC STUDIES: I have personally reviewed the radiological images as listed and agreed with the findings in the report. No results found.    ASSESSMENT & PLAN:  1. Prostate cancer metastatic to bone ([place])   2. Encounter for antineoplastic chemotherapy   3. Genetic testing   4. Weak urine stream   5. Port-A-Cath in place   6. 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 PSA has responded to treatment since the restart of [place] and [place]. PSA 12.27. continue doxazosin 2mg  daily.   #Continue androgen deprivation therapy.  Proceed with [place] Koller today.  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  #Bone metastasis, discussed with patient about the rationale of starting Xgeva.  Advised patient to obtain dental clearance. #Anemia with hemoglobin 11.5.  Stable.  Continue to monitor. .  # Port A cath, continue port flush every 6- 8 week.  #Depression, mood is better today.  He is not taking Lexapro.  Prefers not to take anything at this point. #Family history of cancer and personal history of prostate cancer, patient has establish care with genetic counselor.  Test is pending.   Orders Placed This Encounter  Procedures  . CBC with  Differential/Platelet    Standing Status:   Standing    Number of Occurrences:   20    Standing Expiration Date:   10/28/2020  . Comprehensive metabolic panel    Standing Status:   Standing    Number of Occurrences:   20    Standing Expiration Date:   10/28/2020  . PSA    Standing Status:   Standing    Number of Occurrences:   20    Standing Expiration Date:   10/28/2020    All questions were answered. The patient knows to call the clinic with any problems questions or concerns.  cc Bakare, Mobolaji B, MD   Return of visit: 4 weeks.   Earlie Server, MD, PhD Hematology Oncology [facility] at [facility] Pager- [phone] 10/29/2019

## 2019-10-29 NOTE — Progress Notes (Signed)
Patient here for follow up. Pt complains of both ankles being swollen and would like to know if prednisone can be refilled. Also requesting refill on doxazosin.

## 2019-10-30 ENCOUNTER — Other Ambulatory Visit: Payer: Self-pay | Admitting: Oncology

## 2019-10-30 DIAGNOSIS — C61 Malignant neoplasm of prostate: Secondary | ICD-10-CM

## 2019-11-05 ENCOUNTER — Telehealth: Payer: Self-pay | Admitting: Pharmacy Technician

## 2019-11-05 MED FILL — ABIRATERONE ACETATE 250 MG: 250 | 30 days supply | Qty: 120 | Fill #0

## 2019-11-05 NOTE — Telephone Encounter (Signed)
Oral Oncology Patient Advocate Encounter  Received notification from [employer] that prior authorization for Fabio Asa is required.  PA submitted on CoverMyMeds Key [id number]  Status is pending  Oral Oncology Clinic will continue to follow.  [place] Patient [place] Phone [phone] Fax [phone] 11/05/2019 2:40 PM

## 2019-11-05 NOTE — Telephone Encounter (Signed)
Oral Oncology Patient Advocate Encounter  Prior Authorization for Abiraterone (generic Fabio Asa) has been approved.    PA# [id number] Effective dates: 11/05/19 through 11/04/20  Patients co-pay is $0.00.  Oral Oncology Clinic will continue to follow.   [place] Patient [place] Phone [phone] Fax [phone] 11/05/2019 2:47 PM

## 2019-11-26 ENCOUNTER — Other Ambulatory Visit: Payer: Self-pay

## 2019-11-26 ENCOUNTER — Inpatient Hospital Stay: Payer: Medicare HMO | Attending: Oncology

## 2019-11-26 ENCOUNTER — Inpatient Hospital Stay: Payer: Medicare HMO

## 2019-11-26 ENCOUNTER — Inpatient Hospital Stay: Payer: Medicare HMO | Admitting: Oncology

## 2019-11-26 ENCOUNTER — Encounter: Payer: Self-pay | Admitting: Oncology

## 2019-11-26 ENCOUNTER — Other Ambulatory Visit: Payer: Self-pay | Admitting: *Deleted

## 2019-11-26 VITALS — BP 167/97 | HR 72 | Temp 96.1°F | Resp 18 | Wt 187.8 lb

## 2019-11-26 DIAGNOSIS — Z7952 Long term (current) use of systemic steroids: Secondary | ICD-10-CM | POA: Diagnosis not present

## 2019-11-26 DIAGNOSIS — Z79818 Long term (current) use of other agents affecting estrogen receptors and estrogen levels: Secondary | ICD-10-CM | POA: Diagnosis not present

## 2019-11-26 DIAGNOSIS — C61 Malignant neoplasm of prostate: Secondary | ICD-10-CM

## 2019-11-26 DIAGNOSIS — C7951 Secondary malignant neoplasm of bone: Secondary | ICD-10-CM

## 2019-11-26 DIAGNOSIS — Z5111 Encounter for antineoplastic chemotherapy: Secondary | ICD-10-CM | POA: Diagnosis not present

## 2019-11-26 DIAGNOSIS — Z808 Family history of malignant neoplasm of other organs or systems: Secondary | ICD-10-CM | POA: Diagnosis not present

## 2019-11-26 DIAGNOSIS — R3912 Poor urinary stream: Secondary | ICD-10-CM | POA: Diagnosis not present

## 2019-11-26 DIAGNOSIS — R7989 Other specified abnormal findings of blood chemistry: Secondary | ICD-10-CM | POA: Diagnosis not present

## 2019-11-26 DIAGNOSIS — R748 Abnormal levels of other serum enzymes: Secondary | ICD-10-CM | POA: Diagnosis not present

## 2019-11-26 DIAGNOSIS — N1831 Chronic kidney disease, stage 3a: Secondary | ICD-10-CM

## 2019-11-26 DIAGNOSIS — R5383 Other fatigue: Secondary | ICD-10-CM | POA: Insufficient documentation

## 2019-11-26 DIAGNOSIS — Z803 Family history of malignant neoplasm of breast: Secondary | ICD-10-CM | POA: Diagnosis not present

## 2019-11-26 DIAGNOSIS — Z8042 Family history of malignant neoplasm of prostate: Secondary | ICD-10-CM | POA: Insufficient documentation

## 2019-11-26 DIAGNOSIS — Z79899 Other long term (current) drug therapy: Secondary | ICD-10-CM | POA: Diagnosis not present

## 2019-11-26 DIAGNOSIS — R350 Frequency of micturition: Secondary | ICD-10-CM

## 2019-11-26 DIAGNOSIS — Z95828 Presence of other vascular implants and grafts: Secondary | ICD-10-CM

## 2019-11-26 DIAGNOSIS — D649 Anemia, unspecified: Secondary | ICD-10-CM | POA: Diagnosis not present

## 2019-11-26 LAB — CBC WITH DIFFERENTIAL/PLATELET
Abs Immature Granulocytes: 0.02 10*3/uL (ref 0.00–0.07)
Basophils Absolute: 0 10*3/uL (ref 0.0–0.1)
Basophils Relative: 0 %
Eosinophils Absolute: 0 10*3/uL (ref 0.0–0.5)
Eosinophils Relative: 1 %
HCT: 35.3 % — ABNORMAL LOW (ref 39.0–52.0)
Hemoglobin: 11.6 g/dL — ABNORMAL LOW (ref 13.0–17.0)
Immature Granulocytes: 1 %
Lymphocytes Relative: 39 %
Lymphs Abs: 1.7 10*3/uL (ref 0.7–4.0)
MCH: 29 pg (ref 26.0–34.0)
MCHC: 32.9 g/dL (ref 30.0–36.0)
MCV: 88.3 fL (ref 80.0–100.0)
Monocytes Absolute: 0.4 10*3/uL (ref 0.1–1.0)
Monocytes Relative: 8 %
Neutro Abs: 2.2 10*3/uL (ref 1.7–7.7)
Neutrophils Relative %: 51 %
Platelets: 163 10*3/uL (ref 150–400)
RBC: 4 MIL/uL — ABNORMAL LOW (ref 4.22–5.81)
RDW: 14.9 % (ref 11.5–15.5)
WBC: 4.4 10*3/uL (ref 4.0–10.5)
nRBC: 0 % (ref 0.0–0.2)

## 2019-11-26 LAB — URINALYSIS, COMPLETE (UACMP) WITH MICROSCOPIC
Bacteria, UA: NONE SEEN
Bilirubin Urine: NEGATIVE
Glucose, UA: NEGATIVE mg/dL
Hgb urine dipstick: NEGATIVE
Ketones, ur: NEGATIVE mg/dL
Leukocytes,Ua: NEGATIVE
Nitrite: NEGATIVE
Protein, ur: NEGATIVE mg/dL
Specific Gravity, Urine: 1.017 (ref 1.005–1.030)
pH: 5 (ref 5.0–8.0)

## 2019-11-26 LAB — COMPREHENSIVE METABOLIC PANEL
ALT: 13 U/L (ref 0–44)
AST: 14 U/L — ABNORMAL LOW (ref 15–41)
Albumin: 4 g/dL (ref 3.5–5.0)
Alkaline Phosphatase: 126 U/L (ref 38–126)
Anion gap: 7 (ref 5–15)
BUN: 26 mg/dL — ABNORMAL HIGH (ref 8–23)
CO2: 25 mmol/L (ref 22–32)
Calcium: 9.3 mg/dL (ref 8.9–10.3)
Chloride: 108 mmol/L (ref 98–111)
Creatinine, Ser: 1.7 mg/dL — ABNORMAL HIGH (ref 0.61–1.24)
GFR calc Af Amer: 46 mL/min — ABNORMAL LOW (ref >60)
GFR calc non Af Amer: 40 mL/min — ABNORMAL LOW (ref >60)
Glucose, Bld: 104 mg/dL — ABNORMAL HIGH (ref 70–99)
Potassium: 3.7 mmol/L (ref 3.5–5.1)
Sodium: 140 mmol/L (ref 135–145)
Total Bilirubin: 0.7 mg/dL (ref 0.3–1.2)
Total Protein: 6.1 g/dL — ABNORMAL LOW (ref 6.5–8.1)

## 2019-11-26 LAB — PSA: Prostatic Specific Antigen: 31.16 ng/mL — ABNORMAL HIGH (ref 0.00–4.00)

## 2019-11-26 MED ORDER — DEGARELIX ACETATE 80 MG ~~LOC~~ SOLR
80.0000 mg | Freq: Once | SUBCUTANEOUS | Status: AC
  Administered 2019-11-26: 15:00:00 80 mg via SUBCUTANEOUS
  Filled 2019-11-26: qty 4

## 2019-11-26 NOTE — Progress Notes (Signed)
Patient here for follow up. Reports that hot flashes are getting worse. Pt has been having increased urination for the past 2 weeks.

## 2019-11-26 NOTE — Progress Notes (Signed)
Hematology Oncology Progress Note.   REASON FOR VISIT Follow up for treatment of metastatic prostate cancer.   PERTINENT ONCOLOGY HISTORY Tyler Stevenson is a 69 y.o.[place] who has above oncology history reviewed by me today presented for follow up visit for management of prostate cancer  Patient reports a history of prostate cancer. Initially diagnosed in [place]. He was treated at [facility].  Reports that he was started on hormone blocker treatments-possibly Lupron and he did not tolerate that well.  Patient moved his care to [place] oncologist Patient moved to [facility] to live with his cousin.  Patient reports having difficulty initiating a stream and urinary stream has been weak. His medication list shows that he is on Casodex 50 mg daily, Zytiga 250 mg 4 tablets by mouth dailyand prednisone.  Also on Flomax and doxazosin. Patient reports that he has not been taking any medication for the past few weeks.  Cannot recall details of the timeframe 06/22/2019 Blood work doneWith primary care provider showed increased alkaline phosphatase 153, PSA at the level of 1004.  #  obtained patient's medical records from [place]. Extensive medical record review was performed by me. Per [place] oncologist notes, Approximately June 2019 patient was started on Eligard. 05/22/2018, patient was on Eligard/Casodex combo, PSA dropped from 3210 down to 59.7.  Patient also was treated with Xgeva.  Per note, patient reports that he feels better with improved appetite, gained weight 09/28/2018 patient was continued on Eligard/Casodex along with Xgeva.  PSA has dropped to 1.14 in September 2019. 11/30/2018 patient is due on Eligard/Casodex/Xgeva, PSA was rising for the past 2 months. 12/21/2018 patient has had a rapid increase in the PSA was considered to become castration resistant.   And was switched to Zytiga plus prednisone.  Casodex was stopped. 01/30/2019 on Zytiga plus  prednisone.  He feels significant improvement in the bone pain since start of Zytiga. 02/27/2019 PSA was 150. 02/2019, PSA rose to 324 and decision was made to start Taxotere treatments. Port was placed in June 2020. It was not clear how many cycles of Taxotere that patient received. 04/24/2019 patient was responding to Taxotere with significant drop of the PSA to 128 patient experienced effects from Taxotere including fatigue, mild constipation.  05/17/2019 patient missed his chemo on 05/03/2019 since he was in [place]. 05/30/2019 he probably get another Taxotere at that time. Per note patient had MRI brain 05/23/2019 which showed no acute intracranial process.  Mild generalized volume loss.  Mild to moderate sequela of chronic small vessel ischemic disease.  No evidence of intracranial metastatic disease.  #Genetic testing: Patient has a special care with to that counselor on 10/11/2019.  Genetic testing found no pathologic mutations.    INTERVAL HISTORY Tyler Stevenson is a 69 y.o. male who has above history reviewed by me today presents for follow up visit for management of metastatic prostate cancer  Problems and complaints are listed below: #Prostate cancer, patient has been on Abiateron 750 mg with prednisone 5 mg daily. Patient reports increased frequency of urination at night. Endorses hot flashes.  Otherwise no new complaints. Currently lives in [place] until he figures out other arrangement of living in [place].   Review of Systems  Constitutional: Positive for fatigue. Negative for appetite change, chills, fever and unexpected weight change.  HENT:   Negative for hearing loss and voice change.   Eyes: Negative for eye problems and icterus.  Respiratory: Negative for chest tightness, cough and shortness of  breath.   Cardiovascular: Negative for chest pain and leg swelling.  Gastrointestinal: Negative for abdominal distention and abdominal pain.  Endocrine: Negative for  hot flashes.  Genitourinary: Negative for difficulty urinating, dysuria and frequency.   Musculoskeletal: Negative for arthralgias.  Skin: Negative for itching and rash.  Neurological: Negative for light-headedness and numbness.  Hematological: Negative for adenopathy. Does not bruise/bleed easily.  Psychiatric/Behavioral: Negative for confusion and depression. The patient is not nervous/anxious.     MEDICAL HISTORY:  Past Medical History:  Diagnosis Date  . Family history of breast cancer   . Family history of prostate cancer   . Family history of skin cancer   . Prostate cancer [facility])     SURGICAL HISTORY: Past Surgical History:  Procedure Laterality Date  . ANAL FISSURE REPAIR    . HEMORROIDECTOMY      SOCIAL HISTORY: Social History   Socioeconomic History  . Marital status: Divorced    Spouse name: Not on file  . Number of children: Not on file  . Years of education: Not on file  . Highest education level: Not on file  Occupational History  . Not on file  Tobacco Use  . Smoking status: Never Smoker  . Smokeless tobacco: Never Used  Substance and Sexual Activity  . Alcohol use: Never  . Drug use: Never  . Sexual activity: Not Currently  Other Topics Concern  . Not on file  Social History Narrative  . Not on file   Social Determinants of Health   Financial Resource Strain:   . Difficulty of Paying Living Expenses: Not on file  Food Insecurity:   . Worried About Charity fundraiser in the Last Year: Not on file  . Ran Out of Food in the Last Year: Not on file  Transportation Needs:   . Lack of Transportation (Medical): Not on file  . Lack of Transportation (Non-Medical): Not on file  Physical Activity:   . Days of Exercise per Week: Not on file  . Minutes of Exercise per Session: Not on file  Stress:   . Feeling of Stress : Not on file  Social Connections:   . Frequency of Communication with Friends and Family: Not on file  . Frequency of Social  Gatherings with Friends and Family: Not on file  . Attends Religious Services: Not on file  . Active Member of Clubs or Organizations: Not on file  . Attends Archivist Meetings: Not on file  . Marital Status: Not on file  Intimate Partner Violence:   . Fear of Current or Ex-Partner: Not on file  . Emotionally Abused: Not on file  . Physically Abused: Not on file  . Sexually Abused: Not on file    FAMILY HISTORY: Family History  Problem Relation Age of Onset  . Skin cancer Father        metastasized to brain  . Breast cancer Sister 51  . Prostate cancer Paternal Grandfather 41  . Throat cancer Brother 30  . Prostate cancer Maternal Grandfather 50    ALLERGIES:  is allergic to histamine.  MEDICATIONS:  Current Outpatient Medications  Medication Sig Dispense Refill  . abiraterone acetate (ZYTIGA) 250 MG tablet TAKE 4 TABLETS (1,000 MG TOTAL) BY MOUTH DAILY. TAKE ON AN EMPTY STOMACH 1 HOUR BEFORE OR 2 HOURS AFTER A MEAL 120 tablet 0  . acetaminophen (TYLENOL 8 HOUR) 650 MG CR tablet Take 650 mg by mouth every 8 (eight) hours as needed for pain.    Marland Kitchen  APPLE CIDER VINEGAR PO Take by mouth.    . Ascorbic Acid (VITAMIN C) 1000 MG tablet Take 1,000 mg by mouth daily. Vitamin c Crystals with Calcium 110mg .    . BETA CAROTENE PO Take 7,500 mcg by mouth daily.    . Biotin 1000 MCG tablet Take 1,000 mcg by mouth 2 (two) times daily.    Marland Kitchen doxazosin (CARDURA) 2 MG tablet TAKE 1 TABLET BY MOUTH EVERY DAY 90 tablet 1  . ergocalciferol (VITAMIN D2) 1.25 MG (50000 UT) capsule Take 5,000 Units by mouth 2 (two) times daily.    . Garlic 123XX123 MG CAPS Take 1,000 mg by mouth 2 (two) times daily.    . Ginger, Zingiber officinalis, (GINGER ROOT) 550 MG CAPS Take 1 capsule by mouth daily.    . Magnesium 300 MG CAPS Take by mouth 2 (two) times daily.    . predniSONE (DELTASONE) 5 MG tablet Take 1 tablet (5 mg total) by mouth 2 (two) times daily. 60 tablet 1  . acetaminophen-codeine (TYLENOL #3)  300-30 MG tablet Take 1 tablet by mouth every 12 (twelve) hours as needed for moderate pain or severe pain. (Patient not taking: Reported on 08/29/2019) 30 tablet 0  . escitalopram (LEXAPRO) 10 MG tablet Take 1 tablet (10 mg total) by mouth daily. (Patient not taking: Reported on 09/28/2019) 30 tablet 0  . gabapentin (NEURONTIN) 300 MG capsule Take 300 mg by mouth at bedtime.     No current facility-administered medications for this visit.     PHYSICAL EXAMINATION: ECOG PERFORMANCE STATUS: 1 - Symptomatic but completely ambulatory Vitals:   11/26/19 1337  BP: (!) 167/97  Pulse: 72  Resp: 18  Temp: (!) 96.1 F (35.6 C)   Filed Weights   11/26/19 1337  Weight: 187 lb 12.8 oz (85.2 kg)    Physical Exam Constitutional:      General: He is not in acute distress. HENT:     Head: Normocephalic and atraumatic.  Eyes:     General: No scleral icterus.    Pupils: Pupils are equal, round, and reactive to light.  Cardiovascular:     Rate and Rhythm: Normal rate and regular rhythm.     Heart sounds: Normal heart sounds.  Pulmonary:     Effort: Pulmonary effort is normal. No respiratory distress.     Breath sounds: No wheezing.  Abdominal:     General: Bowel sounds are normal. There is no distension.     Palpations: Abdomen is soft. There is no mass.     Tenderness: There is no abdominal tenderness.  Musculoskeletal:        General: No deformity. Normal range of motion.     Cervical back: Normal range of motion and neck supple.  Skin:    General: Skin is warm and dry.     Findings: No erythema or rash.  Neurological:     Mental Status: He is alert and oriented to person, place, and time. Mental status is at baseline.     Cranial Nerves: No cranial nerve deficit.     Coordination: Coordination normal.  Psychiatric:        Mood and Affect: Mood normal.        Behavior: Behavior normal.        Thought Content: Thought content normal.     Comments:  Denies any suicidal ideation       LABORATORY DATA:  I have reviewed the data as listed Lab Results  Component Value Date  WBC 4.4 11/26/2019   HGB 11.6 (L) 11/26/2019   HCT 35.3 (L) 11/26/2019   MCV 88.3 11/26/2019   PLT 163 11/26/2019   Recent Labs    09/28/19 1238 10/29/19 0910 11/26/19 1314  NA 138 141 140  K 4.7 3.7 3.7  CL 109 110 108  CO2 23 25 25   GLUCOSE 111* 107* 104*  BUN 30* 20 26*  CREATININE 1.20 1.70* 1.70*  CALCIUM 8.2* 8.0* 9.3  GFRNONAA >60 40* 40*  GFRAA >60 46* 46*  PROT 6.5 6.4* 6.1*  ALBUMIN 4.1 3.9 4.0  AST 14* 16 14*  ALT 11 15 13   ALKPHOS 310* 201* 126  BILITOT 0.6 0.7 0.7   Iron/TIBC/Ferritin/ %Sat    Component Value Date/Time   IRON 53 07/06/2019 0958   TIBC 267 07/06/2019 0958   FERRITIN 880 (H) 07/06/2019 0958   IRONPCTSAT 20 07/06/2019 0958      RADIOGRAPHIC STUDIES: I have personally reviewed the radiological images as listed and agreed with the findings in the report. No results found.    ASSESSMENT & PLAN:  1. Prostate cancer ([place])   2. Increased frequency of urination   3. Encounter for antineoplastic chemotherapy   4. Port-A-Cath in place   5. Stage 3a chronic kidney disease   6. 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  Continue Zytiga 1000 mg daily and prednisone 5 mg twice daily. PSA has decreased to 12.27. Today's PSA is pending.  #Urinary frequency, questionable prostate enlargement symptoms.  Continue doxazosin 2 mg daily. Check UA.  #Elevated creatinine, creatinine is 1.7, persistently higher than his baseline. Check US renal.  #Androgen deprivation therapy, proceed with [place] today.  Patient will be given Lupron 22.5 mg in 4 weeks. 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  Discussed with patient.  Awaiting dental clearance. #Anemia with hemoglobin 11.6.  Stable.  Continue to monitor .  # Port A cath, continue port flush 8 to 12 weeks. # #Family history of cancer and personal history of prostate cancer, patient has establish care with genetic counselor.  No pathogenic mutations.  Orders Placed This Encounter  Procedures  . US RENAL    Standing Status:   Future    Standing Expiration Date:   01/23/2021    Order Specific Question:   Reason for Exam (SYMPTOM  OR DIAGNOSIS REQUIRED)    Answer:   frequency in urination    Order Specific Question:   Preferred imaging location?    Answer:   [facility]  . Urinalysis, Complete w Microscopic    Standing Status:   Future    Number of Occurrences:   1    Standing Expiration Date:   11/25/2020    All questions were answered. The patient knows to call the clinic with any problems questions or concerns.  cc Bakare, Mobolaji B, MD   Return of visit: 4 weeks.   Earlie Server, MD, PhD Hematology Oncology [facility] at [facility] Pager- [phone] 11/26/2019

## 2019-11-27 ENCOUNTER — Telehealth: Payer: Self-pay | Admitting: *Deleted

## 2019-11-27 ENCOUNTER — Telehealth: Payer: Self-pay

## 2019-11-27 ENCOUNTER — Other Ambulatory Visit: Payer: Self-pay | Admitting: Oncology

## 2019-11-27 DIAGNOSIS — C7951 Secondary malignant neoplasm of bone: Secondary | ICD-10-CM

## 2019-11-27 DIAGNOSIS — C61 Malignant neoplasm of prostate: Secondary | ICD-10-CM

## 2019-11-27 NOTE — Telephone Encounter (Signed)
Lab/MD added to pts already sched 12/24/19 appt CT has been scheduled as requested per MD. Pt is aware of the location,date and time of his appt.  And to pick up prep before appt date. NEW AVS will be mailed out.

## 2019-11-27 NOTE — Telephone Encounter (Signed)
Done...  CT has been scheduled as requested. Pt is aware of the location,date and time of his appt.

## 2019-11-29 ENCOUNTER — Other Ambulatory Visit: Payer: Self-pay

## 2019-11-29 ENCOUNTER — Ambulatory Visit: Payer: Medicare HMO

## 2019-11-29 ENCOUNTER — Ambulatory Visit
Admission: RE | Admit: 2019-11-29 | Discharge: 2019-11-29 | Disposition: A | Discharge status: Home / Self Care | Payer: Medicare HMO | Source: Ambulatory Visit | Attending: Oncology | Admitting: Oncology

## 2019-11-29 DIAGNOSIS — R350 Frequency of micturition: Secondary | ICD-10-CM | POA: Insufficient documentation

## 2019-11-29 DIAGNOSIS — N3289 Other specified disorders of bladder: Secondary | ICD-10-CM | POA: Insufficient documentation

## 2019-11-29 DIAGNOSIS — R93422 Abnormal radiologic findings on diagnostic imaging of left kidney: Secondary | ICD-10-CM | POA: Diagnosis not present

## 2019-12-03 MED FILL — ABIRATERONE ACETATE 250 MG: 250 | 30 days supply | Qty: 120 | Fill #0

## 2019-12-05 ENCOUNTER — Ambulatory Visit
Admission: RE | Admit: 2019-12-05 | Discharge: 2019-12-05 | Disposition: A | Discharge status: Home / Self Care | Payer: Medicare HMO | Source: Ambulatory Visit | Attending: Oncology | Admitting: Oncology

## 2019-12-05 ENCOUNTER — Other Ambulatory Visit: Payer: Self-pay

## 2019-12-05 DIAGNOSIS — C61 Malignant neoplasm of prostate: Secondary | ICD-10-CM | POA: Insufficient documentation

## 2019-12-24 ENCOUNTER — Inpatient Hospital Stay: Payer: Medicare HMO

## 2019-12-24 ENCOUNTER — Ambulatory Visit: Payer: Medicare HMO

## 2019-12-24 ENCOUNTER — Other Ambulatory Visit: Payer: Self-pay

## 2019-12-24 ENCOUNTER — Inpatient Hospital Stay: Payer: Medicare HMO | Attending: Oncology

## 2019-12-24 ENCOUNTER — Encounter: Payer: Self-pay | Admitting: Oncology

## 2019-12-24 ENCOUNTER — Inpatient Hospital Stay: Payer: Medicare HMO | Admitting: Oncology

## 2019-12-24 VITALS — BP 172/84 | HR 73 | Temp 96.6°F | Wt 184.1 lb

## 2019-12-24 DIAGNOSIS — Z7952 Long term (current) use of systemic steroids: Secondary | ICD-10-CM | POA: Insufficient documentation

## 2019-12-24 DIAGNOSIS — C61 Malignant neoplasm of prostate: Secondary | ICD-10-CM

## 2019-12-24 DIAGNOSIS — N289 Disorder of kidney and ureter, unspecified: Secondary | ICD-10-CM | POA: Insufficient documentation

## 2019-12-24 DIAGNOSIS — Z79899 Other long term (current) drug therapy: Secondary | ICD-10-CM | POA: Insufficient documentation

## 2019-12-24 DIAGNOSIS — N1831 Chronic kidney disease, stage 3a: Secondary | ICD-10-CM

## 2019-12-24 DIAGNOSIS — R350 Frequency of micturition: Secondary | ICD-10-CM | POA: Diagnosis not present

## 2019-12-24 DIAGNOSIS — Z95828 Presence of other vascular implants and grafts: Secondary | ICD-10-CM

## 2019-12-24 DIAGNOSIS — G629 Polyneuropathy, unspecified: Secondary | ICD-10-CM | POA: Insufficient documentation

## 2019-12-24 DIAGNOSIS — D649 Anemia, unspecified: Secondary | ICD-10-CM | POA: Diagnosis not present

## 2019-12-24 DIAGNOSIS — C7951 Secondary malignant neoplasm of bone: Secondary | ICD-10-CM | POA: Diagnosis not present

## 2019-12-24 LAB — CBC WITH DIFFERENTIAL/PLATELET
Abs Immature Granulocytes: 0.02 10*3/uL (ref 0.00–0.07)
Basophils Absolute: 0 10*3/uL (ref 0.0–0.1)
Basophils Relative: 0 %
Eosinophils Absolute: 0 10*3/uL (ref 0.0–0.5)
Eosinophils Relative: 0 %
HCT: 33.9 % — ABNORMAL LOW (ref 39.0–52.0)
Hemoglobin: 11.4 g/dL — ABNORMAL LOW (ref 13.0–17.0)
Immature Granulocytes: 1 %
Lymphocytes Relative: 37 %
Lymphs Abs: 1.4 10*3/uL (ref 0.7–4.0)
MCH: 29.6 pg (ref 26.0–34.0)
MCHC: 33.6 g/dL (ref 30.0–36.0)
MCV: 88.1 fL (ref 80.0–100.0)
Monocytes Absolute: 0.3 10*3/uL (ref 0.1–1.0)
Monocytes Relative: 7 %
Neutro Abs: 2.1 10*3/uL (ref 1.7–7.7)
Neutrophils Relative %: 55 %
Platelets: 167 10*3/uL (ref 150–400)
RBC: 3.85 MIL/uL — ABNORMAL LOW (ref 4.22–5.81)
RDW: 14.4 % (ref 11.5–15.5)
WBC: 3.7 10*3/uL — ABNORMAL LOW (ref 4.0–10.5)
nRBC: 0 % (ref 0.0–0.2)

## 2019-12-24 LAB — COMPREHENSIVE METABOLIC PANEL
ALT: 12 U/L (ref 0–44)
AST: 16 U/L (ref 15–41)
Albumin: 4.3 g/dL (ref 3.5–5.0)
Alkaline Phosphatase: 131 U/L — ABNORMAL HIGH (ref 38–126)
Anion gap: 9 (ref 5–15)
BUN: 24 mg/dL — ABNORMAL HIGH (ref 8–23)
CO2: 25 mmol/L (ref 22–32)
Calcium: 9.4 mg/dL (ref 8.9–10.3)
Chloride: 107 mmol/L (ref 98–111)
Creatinine, Ser: 1.65 mg/dL — ABNORMAL HIGH (ref 0.61–1.24)
GFR calc Af Amer: 48 mL/min — ABNORMAL LOW (ref >60)
GFR calc non Af Amer: 41 mL/min — ABNORMAL LOW (ref >60)
Glucose, Bld: 108 mg/dL — ABNORMAL HIGH (ref 70–99)
Potassium: 4 mmol/L (ref 3.5–5.1)
Sodium: 141 mmol/L (ref 135–145)
Total Bilirubin: 1 mg/dL (ref 0.3–1.2)
Total Protein: 6.3 g/dL — ABNORMAL LOW (ref 6.5–8.1)

## 2019-12-24 LAB — PSA: Prostatic Specific Antigen: 75.6 ng/mL — ABNORMAL HIGH (ref 0.00–4.00)

## 2019-12-24 MED ORDER — SODIUM CHLORIDE 0.9% FLUSH
10.0000 mL | Freq: Once | INTRAVENOUS | Status: AC
  Administered 2019-12-24: 10 mL via INTRAVENOUS
  Filled 2019-12-24: qty 10

## 2019-12-24 MED ORDER — HEPARIN SOD (PORK) LOCK FLUSH 100 UNIT/ML IV SOLN
500.0000 [IU] | Freq: Once | INTRAVENOUS | Status: AC
  Administered 2019-12-24: 500 [IU] via INTRAVENOUS
  Filled 2019-12-24: qty 5

## 2019-12-24 MED ORDER — LEUPROLIDE ACETATE (3 MONTH) 22.5 MG ~~LOC~~ KIT
22.5000 mg | PACK | Freq: Once | SUBCUTANEOUS | Status: AC
  Administered 2019-12-24: 22.5 mg via SUBCUTANEOUS
  Filled 2019-12-24: qty 22.5

## 2019-12-24 NOTE — Progress Notes (Signed)
Hematology Oncology Progress Note.   REASON FOR VISIT Follow up for treatment of metastatic prostate cancer.   PERTINENT ONCOLOGY HISTORY Tyler Stevenson is a 69 y.o.[place] who has above oncology history reviewed by me today presented for follow up visit for management of prostate cancer  Patient reports a history of prostate cancer. Initially diagnosed in [place]. He was treated at [facility].  Reports that he was started on hormone blocker treatments-possibly Lupron and he did not tolerate that well.  Patient moved his care to [place] oncologist Patient moved to [facility] to live with his cousin.  Patient reports having difficulty initiating a stream and urinary stream has been weak. His medication list shows that he is on Casodex 50 mg daily, Zytiga 250 mg 4 tablets by mouth dailyand prednisone.  Also on Flomax and doxazosin. Patient reports that he has not been taking any medication for the past few weeks.  Cannot recall details of the timeframe 06/22/2019 Blood work doneWith primary care provider showed increased alkaline phosphatase 153, PSA at the level of 1004.  #  obtained patient's medical records from [facility]. Extensive medical record review was performed by me. Per [place] oncologist notes, Approximately June 2019 patient was started on Eligard. 05/22/2018, patient was on Eligard/Casodex combo, PSA dropped from 3210 down to 59.7.  Patient also was treated with Xgeva.  Per note, patient reports that he feels better with improved appetite, gained weight 09/28/2018 patient was continued on Eligard/Casodex along with Xgeva.  PSA has dropped to 1.14 in September 2019. 11/30/2018 patient is due on Eligard/Casodex/Xgeva, PSA was rising for the past 2 months. 12/21/2018 patient has had a rapid increase in the PSA was considered to become castration resistant.   And was switched to Zytiga plus prednisone.  Casodex was stopped. 01/30/2019 on Zytiga plus  prednisone.  He feels significant improvement in the bone pain since start of Zytiga. 02/27/2019 PSA was 150. 02/2019, PSA rose to 324 and decision was made to start Taxotere treatments. Port was placed in June 2020. It was not clear how many cycles of Taxotere that patient received. 04/24/2019 patient was responding to Taxotere with significant drop of the PSA to 128 patient experienced effects from Taxotere including fatigue, mild constipation.  05/17/2019 patient missed his chemo on 05/03/2019 since he was in [place]. 05/30/2019 he probably get another Taxotere at that time. Per note patient had MRI brain 05/23/2019 which showed no acute intracranial process.  Mild generalized volume loss.  Mild to moderate sequela of chronic small vessel ischemic disease.  No evidence of intracranial metastatic disease.  #Genetic testing: Patient has a special care with to that counselor on 10/11/2019.  Genetic testing found no pathologic mutations.    INTERVAL HISTORY Tyler Stevenson is a 69 y.o. male who has above history reviewed by me today presents for follow up visit for management of metastatic prostate cancer  Problems and complaints are listed below: #Prostate cancer, patient has been on Abiateron 750 mg with prednisone 5 mg daily. Blood pressure is high in the clinic 172/84.  Patient reports being started on Norvasc.  He cannot recall the dosage of Norvasc.  He does not measure blood pressure at home.  He is planning to buy home blood pressure monitor  Manageable hot flashes.  Currently lives in [place] until he figures out other arrangement of living in [place].  Review of Systems  Constitutional: Positive for fatigue. Negative for appetite change, chills, fever and unexpected weight change.  HENT:   Negative for hearing loss and voice change.   Eyes: Negative for eye problems and icterus.  Respiratory: Negative for chest tightness, cough and shortness of breath.   Cardiovascular:  Negative for chest pain and leg swelling.  Gastrointestinal: Negative for abdominal distention and abdominal pain.  Endocrine: Negative for hot flashes.  Genitourinary: Negative for difficulty urinating, dysuria and frequency.   Musculoskeletal: Negative for arthralgias.  Skin: Negative for itching and rash.  Neurological: Negative for light-headedness and numbness.  Hematological: Negative for adenopathy. Does not bruise/bleed easily.  Psychiatric/Behavioral: Negative for confusion and depression. The patient is not nervous/anxious.     MEDICAL HISTORY:  Past Medical History:  Diagnosis Date  . Family history of breast cancer   . Family history of prostate cancer   . Family history of skin cancer   . Prostate cancer [facility])     SURGICAL HISTORY: Past Surgical History:  Procedure Laterality Date  . ANAL FISSURE REPAIR    . HEMORROIDECTOMY      SOCIAL HISTORY: Social History   Socioeconomic History  . Marital status: Divorced    Spouse name: Not on file  . Number of children: Not on file  . Years of education: Not on file  . Highest education level: Not on file  Occupational History  . Not on file  Tobacco Use  . Smoking status: Never Smoker  . Smokeless tobacco: Never Used  Substance and Sexual Activity  . Alcohol use: Never  . Drug use: Never  . Sexual activity: Not Currently  Other Topics Concern  . Not on file  Social History Narrative  . Not on file   Social Determinants of Health   Financial Resource Strain:   . Difficulty of Paying Living Expenses: Not on file  Food Insecurity:   . Worried About Charity fundraiser in the Last Year: Not on file  . Ran Out of Food in the Last Year: Not on file  Transportation Needs:   . Lack of Transportation (Medical): Not on file  . Lack of Transportation (Non-Medical): Not on file  Physical Activity:   . Days of Exercise per Week: Not on file  . Minutes of Exercise per Session: Not on file  Stress:   . Feeling of  Stress : Not on file  Social Connections:   . Frequency of Communication with Friends and Family: Not on file  . Frequency of Social Gatherings with Friends and Family: Not on file  . Attends Religious Services: Not on file  . Active Member of Clubs or Organizations: Not on file  . Attends Archivist Meetings: Not on file  . Marital Status: Not on file  Intimate Partner Violence:   . Fear of Current or Ex-Partner: Not on file  . Emotionally Abused: Not on file  . Physically Abused: Not on file  . Sexually Abused: Not on file    FAMILY HISTORY: Family History  Problem Relation Age of Onset  . Skin cancer Father        metastasized to brain  . Breast cancer Sister 49  . Prostate cancer Paternal Grandfather 65  . Throat cancer Brother 30  . Prostate cancer Maternal Grandfather 50    ALLERGIES:  is allergic to histamine.  MEDICATIONS:  Current Outpatient Medications  Medication Sig Dispense Refill  . abiraterone acetate (ZYTIGA) 250 MG tablet TAKE 4 TABLETS (1,000 MG TOTAL) BY MOUTH DAILY. TAKE ON AN EMPTY STOMACH 1 HOUR BEFORE OR 2 HOURS AFTER  A MEAL 120 tablet 0  . acetaminophen (TYLENOL 8 HOUR) 650 MG CR tablet Take 650 mg by mouth every 8 (eight) hours as needed for pain.    . APPLE CIDER VINEGAR PO Take by mouth.    . Ascorbic Acid (VITAMIN C) 1000 MG tablet Take 1,000 mg by mouth daily. Vitamin c Crystals with Calcium 110mg .    . BETA CAROTENE PO Take 7,500 mcg by mouth daily.    . Biotin 1000 MCG tablet Take 1,000 mcg by mouth 2 (two) times daily.    . Coenzyme Q10 (COQ10 PO) Take by mouth.    . doxazosin (CARDURA) 2 MG tablet TAKE 1 TABLET BY MOUTH EVERY DAY 90 tablet 1  . ergocalciferol (VITAMIN D2) 1.25 MG (50000 UT) capsule Take 5,000 Units by mouth 2 (two) times daily.    Marland Kitchen gabapentin (NEURONTIN) 300 MG capsule Take 300 mg by mouth at bedtime.    . Garlic 123XX123 MG CAPS Take 1,000 mg by mouth 2 (two) times daily.    . Ginger, Zingiber officinalis, (GINGER  ROOT) 550 MG CAPS Take 1 capsule by mouth daily.    Javier Docker Oil (OMEGA-3) 500 MG CAPS Take 1 capsule by mouth daily.    . Magnesium 300 MG CAPS Take by mouth 2 (two) times daily.    . multivitamin-lutein (OCUVITE-LUTEIN) CAPS capsule Take 1 capsule by mouth daily.    . predniSONE (DELTASONE) 5 MG tablet Take 1 tablet (5 mg total) by mouth 2 (two) times daily. 60 tablet 1  . acetaminophen-codeine (TYLENOL #3) 300-30 MG tablet Take 1 tablet by mouth every 12 (twelve) hours as needed for moderate pain or severe pain. (Patient not taking: Reported on 08/29/2019) 30 tablet 0  . escitalopram (LEXAPRO) 10 MG tablet Take 1 tablet (10 mg total) by mouth daily. (Patient not taking: Reported on 09/28/2019) 30 tablet 0   No current facility-administered medications for this visit.     PHYSICAL EXAMINATION: ECOG PERFORMANCE STATUS: 1 - Symptomatic but completely ambulatory Vitals:   12/24/19 1405  BP: (!) 172/84  Pulse: 73  Temp: (!) 96.6 F (35.9 C)   Filed Weights   12/24/19 1405  Weight: 184 lb 1.6 oz (83.5 kg)    Physical Exam Constitutional:      General: He is not in acute distress. HENT:     Head: Normocephalic and atraumatic.  Eyes:     General: No scleral icterus. Cardiovascular:     Rate and Rhythm: Normal rate and regular rhythm.     Heart sounds: Normal heart sounds.  Pulmonary:     Effort: Pulmonary effort is normal. No respiratory distress.     Breath sounds: No wheezing.  Abdominal:     General: Bowel sounds are normal. There is no distension.     Palpations: Abdomen is soft.  Musculoskeletal:        General: No deformity. Normal range of motion.     Cervical back: Normal range of motion and neck supple.  Skin:    General: Skin is warm and dry.     Findings: No erythema or rash.  Neurological:     Mental Status: He is alert and oriented to person, place, and time. Mental status is at baseline.     Cranial Nerves: No cranial nerve deficit.     Coordination:  Coordination normal.  Psychiatric:        Mood and Affect: Mood normal.     LABORATORY DATA:  I have reviewed the data  as listed Lab Results  Component Value Date   WBC 3.7 (L) 12/24/2019   HGB 11.4 (L) 12/24/2019   HCT 33.9 (L) 12/24/2019   MCV 88.1 12/24/2019   PLT 167 12/24/2019   Recent Labs    10/29/19 0910 11/26/19 1314 12/24/19 1346  NA 141 140 141  K 3.7 3.7 4.0  CL 110 108 107  CO2 25 25 25   GLUCOSE 107* 104* 108*  BUN 20 26* 24*  CREATININE 1.70* 1.70* 1.65*  CALCIUM 8.0* 9.3 9.4  GFRNONAA 40* 40* 41*  GFRAA 46* 46* 48*  PROT 6.4* 6.1* 6.3*  ALBUMIN 3.9 4.0 4.3  AST 16 14* 16  ALT 15 13 12   ALKPHOS 201* 126 131*  BILITOT 0.7 0.7 1.0   Iron/TIBC/Ferritin/ %Sat    Component Value Date/Time   IRON 53 07/06/2019 0958   TIBC 267 07/06/2019 0958   FERRITIN 880 (H) 07/06/2019 0958   IRONPCTSAT 20 07/06/2019 0958      RADIOGRAPHIC STUDIES: I have personally reviewed the radiological images as listed and agreed with the findings in the report. CT ABDOMEN PELVIS WO CONTRAST  Result Date: 12/06/2019 CLINICAL DATA:  Prostate cancer restaging EXAM: CT CHEST, ABDOMEN AND PELVIS WITHOUT CONTRAST TECHNIQUE: Multidetector CT imaging of the chest, abdomen and pelvis was performed following the standard protocol without IV contrast. COMPARISON:  Multiple exams, including CT abdomen from 08/03/2019 and renal ultrasound from 11/29/2019 FINDINGS: CT CHEST FINDINGS Cardiovascular: Right Port-A-Cath tip: Right atrium. Atherosclerotic calcification of the thoracic aorta and left anterior descending coronary artery. Mediastinum/Nodes: Unremarkable Lungs/Pleura: Mild scarring or atelectasis along the right major fissure with very faint ground-glass opacities in the right lung. Patchy mostly central ground-glass densities in the left upper lobe and left lower lobe are ill-defined; in the left upper lobe this extends as a bandlike region of scarring or atelectasis to the apex. The  ground-glass opacities at the lung bases appear similar to the 08/03/2019 CT abdomen. Mild cylindrical bronchiectasis in both lungs. Musculoskeletal: Skeletal sclerosis diffuse compatible with diffuse widespread osseous metastatic disease. CT ABDOMEN PELVIS FINDINGS Hepatobiliary: Gallstones are present in the gallbladder with nitrogen gas phenomenon. Gallbladder wall thickening is present but may simply be due to nondistention. Pancreas: Unremarkable Spleen: Unremarkable Adrenals/Urinary Tract: Hypodense right kidney lower pole 4.1 by 3.4 cm lesion, internal density 13 Hounsfield units. Hypodense exophytic left kidney upper pole 3.2 by 2.8 cm lesion, internal density 12 Hounsfield units. There is a 0.9 cm partially exophytic lobulation of the left kidney upper pole posteriorly on image 62/2, density similar to the adjacent renal parenchyma. Mildly hypodense 0.9 cm lesion of the left kidney lower pole on image 74/2, internal density 8 Hounsfield units. There is scarring in the left kidney which contributes to a slightly lobulated appearance. Stomach/Bowel: Scattered sigmoid colon diverticula. Upper normal amount of stool in the colon. Vascular/Lymphatic: Aortoiliac atherosclerotic vascular disease. No pathologic adenopathy identified. Reproductive: Curvilinear likely dystrophic calcification along the left side of the prostate gland, not changed from prior. Other: No supplemental non-categorized findings. Musculoskeletal: Diffuse skeletal sclerosis compatible with diffuse osseous metastatic disease, not changed from prior. Multilevel impingement in the lumbar spine due to spondylosis and degenerative disc disease. IMPRESSION: 1. Widespread/diffuse sclerotic osseous metastatic disease, not appreciably changed from prior. 2. No adenopathy identified. 3. Patchy ground-glass opacities in the lungs, left greater than right, with some confluent scarring in the left upper lobe. At the lung bases, this process appears  similar to the 08/03/2019 exam. Possibilities might include hypersensitivity pneumonitis,  obstructive bronchiolitis, or nonspecific interstitial pneumonia. 4. Several renal hypodense lesions are likely cysts. Concern has been raised about a complex lesion of the left kidney upper pole on prior ultrasound; on today's exam the only corroborating candidate lesion is the 0.9 cm partially exophytic lobulation of the left kidney upper pole posteriorly. This could be a complex cyst or a small mass; contrast enhancement is not interrogated on today's noncontrast examination. Presuming the patient is unable to receive IV contrast, surveillance is likely warranted. 5. Other imaging findings of potential clinical significance: Coronary atherosclerosis. Cholelithiasis. Scattered sigmoid colon diverticula. Multilevel impingement in the lumbar spine due to spondylosis and degenerative disc disease. Electronically Signed   By: Van Clines M.D.   On: 12/06/2019 09:02   CT CHEST WO CONTRAST  Result Date: 12/06/2019 CLINICAL DATA:  Prostate cancer restaging EXAM: CT CHEST, ABDOMEN AND PELVIS WITHOUT CONTRAST TECHNIQUE: Multidetector CT imaging of the chest, abdomen and pelvis was performed following the standard protocol without IV contrast. COMPARISON:  Multiple exams, including CT abdomen from 08/03/2019 and renal ultrasound from 11/29/2019 FINDINGS: CT CHEST FINDINGS Cardiovascular: Right Port-A-Cath tip: Right atrium. Atherosclerotic calcification of the thoracic aorta and left anterior descending coronary artery. Mediastinum/Nodes: Unremarkable Lungs/Pleura: Mild scarring or atelectasis along the right major fissure with very faint ground-glass opacities in the right lung. Patchy mostly central ground-glass densities in the left upper lobe and left lower lobe are ill-defined; in the left upper lobe this extends as a bandlike region of scarring or atelectasis to the apex. The ground-glass opacities at the lung bases  appear similar to the 08/03/2019 CT abdomen. Mild cylindrical bronchiectasis in both lungs. Musculoskeletal: Skeletal sclerosis diffuse compatible with diffuse widespread osseous metastatic disease. CT ABDOMEN PELVIS FINDINGS Hepatobiliary: Gallstones are present in the gallbladder with nitrogen gas phenomenon. Gallbladder wall thickening is present but may simply be due to nondistention. Pancreas: Unremarkable Spleen: Unremarkable Adrenals/Urinary Tract: Hypodense right kidney lower pole 4.1 by 3.4 cm lesion, internal density 13 Hounsfield units. Hypodense exophytic left kidney upper pole 3.2 by 2.8 cm lesion, internal density 12 Hounsfield units. There is a 0.9 cm partially exophytic lobulation of the left kidney upper pole posteriorly on image 62/2, density similar to the adjacent renal parenchyma. Mildly hypodense 0.9 cm lesion of the left kidney lower pole on image 74/2, internal density 8 Hounsfield units. There is scarring in the left kidney which contributes to a slightly lobulated appearance. Stomach/Bowel: Scattered sigmoid colon diverticula. Upper normal amount of stool in the colon. Vascular/Lymphatic: Aortoiliac atherosclerotic vascular disease. No pathologic adenopathy identified. Reproductive: Curvilinear likely dystrophic calcification along the left side of the prostate gland, not changed from prior. Other: No supplemental non-categorized findings. Musculoskeletal: Diffuse skeletal sclerosis compatible with diffuse osseous metastatic disease, not changed from prior. Multilevel impingement in the lumbar spine due to spondylosis and degenerative disc disease. IMPRESSION: 1. Widespread/diffuse sclerotic osseous metastatic disease, not appreciably changed from prior. 2. No adenopathy identified. 3. Patchy ground-glass opacities in the lungs, left greater than right, with some confluent scarring in the left upper lobe. At the lung bases, this process appears similar to the 08/03/2019 exam. Possibilities  might include hypersensitivity pneumonitis, obstructive bronchiolitis, or nonspecific interstitial pneumonia. 4. Several renal hypodense lesions are likely cysts. Concern has been raised about a complex lesion of the left kidney upper pole on prior ultrasound; on today's exam the only corroborating candidate lesion is the 0.9 cm partially exophytic lobulation of the left kidney upper pole posteriorly. This could be a complex cyst or  a small mass; contrast enhancement is not interrogated on today's noncontrast examination. Presuming the patient is unable to receive IV contrast, surveillance is likely warranted. 5. Other imaging findings of potential clinical significance: Coronary atherosclerosis. Cholelithiasis. Scattered sigmoid colon diverticula. Multilevel impingement in the lumbar spine due to spondylosis and degenerative disc disease. Electronically Signed   By: Van Clines M.D.   On: 12/06/2019 09:02   US RENAL  Result Date: 11/30/2019 CLINICAL DATA:  Urinary frequency EXAM: RENAL / URINARY TRACT ULTRASOUND COMPLETE COMPARISON:  None. FINDINGS: Right Kidney: Renal measurements: 10.3 x 5.2 x 3.9 cm = volume: 108 mL . Echogenicity within normal limits. Interpolar region cyst measuring up to 4.1 cm. No hydronephrosis visualized. Left Kidney: Renal measurements: 9.4 x 4.3 x 5.4 cm = volume: 112 mL. Echogenicity within normal limits. Upper pole cyst measuring up to 3 cm. Additional upper pole lesion measuring up to 1.4 cm does not demonstrate associated color Doppler flow but is not definitely cystic. No hydronephrosis visualized. Bladder: Circumferential wall thickening. There is some of more asymmetric thickening posteriorly without flow on color Doppler. Other: Prostate volume of 33.9 mL. IMPRESSION: Possible small solid mass of the upper pole of the left kidney. Circumferential bladder wall thickening. Prostate volume 33.9 mL. Electronically Signed   By: Macy Mis M.D.   On: 11/30/2019 08:21       ASSESSMENT & PLAN:  1. Prostate cancer ([place])   2. Increased frequency of urination   3. Prostate cancer metastatic to bone ([facility])   4. Stage 3a chronic kidney disease   5. Kidney lesion   6. Bone metastasis ([place])   7. 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  Today's PSA 75.6. I discussed with patient that although interval CT did not show any radiographic disease progression.  I am concerned that his PSA has progressively getting worse.  Options of docetaxel versus switching to [facility] were discussed with patient.  Patient is reluctant in restarting docetaxel due to neuropathy. For now I will continue Zytiga for another 4 weeks, likely need to be switched to [place] in the near future.    CT chest abdomen pelvis was independently reviewed by me and discussed with patient. Wide spread diffuse sclerotic osseous metastatic disease no change.  No adenopathy identified. Patchy ground opacities in the lung. Several renal hypodense lesions are likely cysts.  There is a small left kidney upper pole complex lesion 0.9 cm.  Indeterminate.  #CKD, worsened.  Recommend to avoid nephrotoxins. Ultrasound renal was reviewed.  Small kidney mass.  CT reviewed.  #Frequent urination, patient is on Doxazosin 2 mg daily.  UA showed no evidence of UTI. Discussed with patient we will refer him to establish care with urology for small kidney mass as well as frequent urination.  Urination management.  #Androgen deprivation therapy, proceed with Eligard 22.5 mg today.  #Bone metastasis, discussed with patient about the rationale of starting Xgeva.  Discussed with patient.  He has not obtain dental clearance yet  #Anemia with hemoglobin 11.4, stable.  Continue to monitor.  .  # Port A cath, continue port flush 8 to 12  weeks.  Orders Placed This Encounter  Procedures  . Ambulatory referral to Urology    Referral Priority:   Routine    Referral Type:   Consultation  Referral Reason:   Specialty Services Required    Referred to Provider:   Billey Co, MD    Requested Specialty:   Urology    Number of Visits Requested:   1    All questions were answered. The patient knows to call the clinic with any problems questions or concerns.  cc Bakare, Mobolaji B, MD   Return of visit: 4 weeks.   Earlie Server, MD, PhD Hematology Oncology [facility] at [facility] Pager- [phone] 12/24/2019

## 2019-12-24 NOTE — Progress Notes (Signed)
Patient contacted for follow up. Pt concerned that prostae is still swollen and is wanting to know if there is something that would help decrease swelling. Neuropathy pain to hand and feet has decerased.

## 2019-12-25 ENCOUNTER — Other Ambulatory Visit: Payer: Self-pay | Admitting: Oncology

## 2019-12-25 DIAGNOSIS — C61 Malignant neoplasm of prostate: Secondary | ICD-10-CM

## 2019-12-25 DIAGNOSIS — C7951 Secondary malignant neoplasm of bone: Secondary | ICD-10-CM

## 2019-12-25 LAB — TESTOSTERONE: Testosterone: <3 ng/dL — ABNORMAL LOW (ref 264–916)

## 2019-12-27 MED FILL — ABIRATERONE ACETATE 250 MG: 250 | 30 days supply | Qty: 120 | Fill #0

## 2020-01-16 ENCOUNTER — Ambulatory Visit: Payer: Medicare HMO | Admitting: Urology

## 2020-01-21 ENCOUNTER — Encounter: Payer: Self-pay | Admitting: Oncology

## 2020-01-21 ENCOUNTER — Inpatient Hospital Stay: Payer: Medicare HMO

## 2020-01-21 ENCOUNTER — Inpatient Hospital Stay: Payer: Medicare HMO | Admitting: Oncology

## 2020-01-21 VITALS — BP 165/87 | HR 55 | Temp 96.7°F | Resp 18 | Wt 186.7 lb

## 2020-01-21 DIAGNOSIS — C61 Malignant neoplasm of prostate: Secondary | ICD-10-CM

## 2020-01-21 DIAGNOSIS — R350 Frequency of micturition: Secondary | ICD-10-CM | POA: Diagnosis not present

## 2020-01-21 DIAGNOSIS — C7951 Secondary malignant neoplasm of bone: Secondary | ICD-10-CM | POA: Diagnosis not present

## 2020-01-21 DIAGNOSIS — N1831 Chronic kidney disease, stage 3a: Secondary | ICD-10-CM | POA: Diagnosis not present

## 2020-01-21 LAB — COMPREHENSIVE METABOLIC PANEL
ALT: 13 U/L (ref 0–44)
AST: 17 U/L (ref 15–41)
Albumin: 3.8 g/dL (ref 3.5–5.0)
Alkaline Phosphatase: 140 U/L — ABNORMAL HIGH (ref 38–126)
Anion gap: 6 (ref 5–15)
BUN: 25 mg/dL — ABNORMAL HIGH (ref 8–23)
CO2: 27 mmol/L (ref 22–32)
Calcium: 9 mg/dL (ref 8.9–10.3)
Chloride: 107 mmol/L (ref 98–111)
Creatinine, Ser: 1.63 mg/dL — ABNORMAL HIGH (ref 0.61–1.24)
GFR calc Af Amer: 49 mL/min — ABNORMAL LOW (ref >60)
GFR calc non Af Amer: 42 mL/min — ABNORMAL LOW (ref >60)
Glucose, Bld: 109 mg/dL — ABNORMAL HIGH (ref 70–99)
Potassium: 3.9 mmol/L (ref 3.5–5.1)
Sodium: 140 mmol/L (ref 135–145)
Total Bilirubin: 0.8 mg/dL (ref 0.3–1.2)
Total Protein: 6 g/dL — ABNORMAL LOW (ref 6.5–8.1)

## 2020-01-21 LAB — CBC WITH DIFFERENTIAL/PLATELET
Abs Immature Granulocytes: 0.02 10*3/uL (ref 0.00–0.07)
Basophils Absolute: 0 10*3/uL (ref 0.0–0.1)
Basophils Relative: 0 %
Eosinophils Absolute: 0 10*3/uL (ref 0.0–0.5)
Eosinophils Relative: 1 %
HCT: 31.2 % — ABNORMAL LOW (ref 39.0–52.0)
Hemoglobin: 10.8 g/dL — ABNORMAL LOW (ref 13.0–17.0)
Immature Granulocytes: 1 %
Lymphocytes Relative: 43 %
Lymphs Abs: 1.5 10*3/uL (ref 0.7–4.0)
MCH: 30 pg (ref 26.0–34.0)
MCHC: 34.6 g/dL (ref 30.0–36.0)
MCV: 86.7 fL (ref 80.0–100.0)
Monocytes Absolute: 0.3 10*3/uL (ref 0.1–1.0)
Monocytes Relative: 7 %
Neutro Abs: 1.7 10*3/uL (ref 1.7–7.7)
Neutrophils Relative %: 48 %
Platelets: 152 10*3/uL (ref 150–400)
RBC: 3.6 MIL/uL — ABNORMAL LOW (ref 4.22–5.81)
RDW: 14.7 % (ref 11.5–15.5)
WBC: 3.5 10*3/uL — ABNORMAL LOW (ref 4.0–10.5)
nRBC: 0 % (ref 0.0–0.2)

## 2020-01-21 LAB — PSA: Prostatic Specific Antigen: 136.59 ng/mL — ABNORMAL HIGH (ref 0.00–4.00)

## 2020-01-21 NOTE — Progress Notes (Signed)
Hematology Oncology Progress Note.   REASON FOR VISIT Follow up for treatment of metastatic prostate cancer.   PERTINENT ONCOLOGY HISTORY Tyler Stevenson is a 69 y.o.[place] who has above oncology history reviewed by me today presented for follow up visit for management of prostate cancer  Patient reports a history of prostate cancer. Initially diagnosed in [place]. He was treated at [facility].  Reports that he was started on hormone blocker treatments-possibly Lupron and he did not tolerate that well.  Patient moved his care to [place] oncologist Patient moved to [facility] to live with his cousin.  Patient reports having difficulty initiating a stream and urinary stream has been weak. His medication list shows that he is on Casodex 50 mg daily, Zytiga 250 mg 4 tablets by mouth dailyand prednisone.  Also on Flomax and doxazosin. Patient reports that he has not been taking any medication for the past few weeks.  Cannot recall details of the timeframe 06/22/2019 Blood work doneWith primary care provider showed increased alkaline phosphatase 153, PSA at the level of 1004.  #  obtained patient's medical records from [facility]. Extensive medical record review was performed by me. Per [place] oncologist notes, Approximately June 2019 patient was started on Eligard. 05/22/2018, patient was on Eligard/Casodex combo, PSA dropped from 3210 down to 59.7.  Patient also was treated with Xgeva.  Per note, patient reports that he feels better with improved appetite, gained weight 09/28/2018 patient was continued on Eligard/Casodex along with Xgeva.  PSA has dropped to 1.14 in September 2019. 11/30/2018 patient is due on Eligard/Casodex/Xgeva, PSA was rising for the past 2 months. 12/21/2018 patient has had a rapid increase in the PSA was considered to become castration resistant.   And was switched to Zytiga plus prednisone.  Casodex was stopped. 01/30/2019 on Zytiga plus  prednisone.  He feels significant improvement in the bone pain since start of Zytiga. 02/27/2019 PSA was 150. 02/2019, PSA rose to 324 and decision was made to start Taxotere treatments. Port was placed in June 2020. It was not clear how many cycles of Taxotere that patient received. 04/24/2019 patient was responding to Taxotere with significant drop of the PSA to 128 patient experienced effects from Taxotere including fatigue, mild constipation.  05/17/2019 patient missed his chemo on 05/03/2019 since he was in [place]. 05/30/2019 he probably get another Taxotere at that time. Per note patient had MRI brain 05/23/2019 which showed no acute intracranial process.  Mild generalized volume loss.  Mild to moderate sequela of chronic small vessel ischemic disease.  No evidence of intracranial metastatic disease.  #Genetic testing: Patient has a special care with to that counselor on 10/11/2019.  Genetic testing found no pathologic mutations.    INTERVAL HISTORY Tyler Stevenson is a 69 y.o. male who has above history reviewed by me today presents for follow up visit for management of metastatic prostate cancer  Problems and complaints are listed below: #Prostate cancer, patient has been on Abiateron 750 mg with prednisone 5 mg daily. He has no new complaints. Currently he lives in [place].  Frequent urination.   Review of Systems  Constitutional: Positive for fatigue. Negative for appetite change, chills, fever and unexpected weight change.  HENT:   Negative for hearing loss and voice change.   Eyes: Negative for eye problems and icterus.  Respiratory: Negative for chest tightness, cough and shortness of breath.   Cardiovascular: Negative for chest pain and leg swelling.  Gastrointestinal: Negative for abdominal distention and  abdominal pain.  Endocrine: Negative for hot flashes.  Genitourinary: Positive for frequency. Negative for difficulty urinating and dysuria.   Musculoskeletal: Negative  for arthralgias.  Skin: Negative for itching and rash.  Neurological: Negative for light-headedness and numbness.  Hematological: Negative for adenopathy. Does not bruise/bleed easily.  Psychiatric/Behavioral: Negative for confusion and depression. The patient is not nervous/anxious.     MEDICAL HISTORY:  Past Medical History:  Diagnosis Date  . Family history of breast cancer   . Family history of prostate cancer   . Family history of skin cancer   . Prostate cancer [facility])     SURGICAL HISTORY: Past Surgical History:  Procedure Laterality Date  . ANAL FISSURE REPAIR    . HEMORROIDECTOMY      SOCIAL HISTORY: Social History   Socioeconomic History  . Marital status: Divorced    Spouse name: Not on file  . Number of children: Not on file  . Years of education: Not on file  . Highest education level: Not on file  Occupational History  . Not on file  Tobacco Use  . Smoking status: Never Smoker  . Smokeless tobacco: Never Used  Substance and Sexual Activity  . Alcohol use: Never  . Drug use: Never  . Sexual activity: Not Currently  Other Topics Concern  . Not on file  Social History Narrative  . Not on file   Social Determinants of Health   Financial Resource Strain:   . Difficulty of Paying Living Expenses:   Food Insecurity:   . Worried About Charity fundraiser in the Last Year:   . Arboriculturist in the Last Year:   Transportation Needs:   . Film/video editor (Medical):   Marland Kitchen Lack of Transportation (Non-Medical):   Physical Activity:   . Days of Exercise per Week:   . Minutes of Exercise per Session:   Stress:   . Feeling of Stress :   Social Connections:   . Frequency of Communication with Friends and Family:   . Frequency of Social Gatherings with Friends and Family:   . Attends Religious Services:   . Active Member of Clubs or Organizations:   . Attends Archivist Meetings:   Marland Kitchen Marital Status:   Intimate Partner Violence:   . Fear of  Current or Ex-Partner:   . Emotionally Abused:   Marland Kitchen Physically Abused:   . Sexually Abused:     FAMILY HISTORY: Family History  Problem Relation Age of Onset  . Skin cancer Father        metastasized to brain  . Breast cancer Sister 61  . Prostate cancer Paternal Grandfather 29  . Throat cancer Brother 30  . Prostate cancer Maternal Grandfather 50    ALLERGIES:  is allergic to histamine.  MEDICATIONS:  Current Outpatient Medications  Medication Sig Dispense Refill  . abiraterone acetate (ZYTIGA) 250 MG tablet TAKE 4 TABLETS (1,000 MG TOTAL) BY MOUTH DAILY. TAKE ON AN EMPTY STOMACH 1 HOUR BEFORE OR 2 HOURS AFTER A MEAL 120 tablet 0  . acetaminophen (TYLENOL 8 HOUR) 650 MG CR tablet Take 650 mg by mouth every 8 (eight) hours as needed for pain.    . APPLE CIDER VINEGAR PO Take by mouth.    . Ascorbic Acid (VITAMIN C) 1000 MG tablet Take 1,000 mg by mouth daily. Vitamin c Crystals with Calcium 110mg .    Marland Kitchen BETA CAROTENE PO Take 7,500 mcg by mouth daily.    . Biotin 1000  MCG tablet Take 1,000 mcg by mouth 2 (two) times daily.    . Coenzyme Q10 (COQ10 PO) Take by mouth.    . doxazosin (CARDURA) 2 MG tablet TAKE 1 TABLET BY MOUTH EVERY DAY 90 tablet 1  . ergocalciferol (VITAMIN D2) 1.25 MG (50000 UT) capsule Take 5,000 Units by mouth 2 (two) times daily.    Marland Kitchen gabapentin (NEURONTIN) 300 MG capsule Take 300 mg by mouth at bedtime.    . Garlic 123XX123 MG CAPS Take 1,000 mg by mouth 2 (two) times daily.    . Ginger, Zingiber officinalis, (GINGER ROOT) 550 MG CAPS Take 1 capsule by mouth daily.    Javier Docker Oil (OMEGA-3) 500 MG CAPS Take 1 capsule by mouth daily.    Marland Kitchen LYCOPENE PO Take by mouth. vitamin    . Magnesium 300 MG CAPS Take by mouth 2 (two) times daily.    . multivitamin-lutein (OCUVITE-LUTEIN) CAPS capsule Take 1 capsule by mouth daily.    . predniSONE (DELTASONE) 5 MG tablet Take 1 tablet (5 mg total) by mouth 2 (two) times daily. 60 tablet 1  . acetaminophen-codeine (TYLENOL #3)  300-30 MG tablet Take 1 tablet by mouth every 12 (twelve) hours as needed for moderate pain or severe pain. (Patient not taking: Reported on 08/29/2019) 30 tablet 0  . escitalopram (LEXAPRO) 10 MG tablet Take 1 tablet (10 mg total) by mouth daily. (Patient not taking: Reported on 09/28/2019) 30 tablet 0   No current facility-administered medications for this visit.     PHYSICAL EXAMINATION: ECOG PERFORMANCE STATUS: 1 - Symptomatic but completely ambulatory Vitals:   01/21/20 1350  BP: (!) 165/87  Pulse: (!) 55  Resp: 18  Temp: (!) 96.7 F (35.9 C)   Filed Weights   01/21/20 1350  Weight: 186 lb 11.2 oz (84.7 kg)    Physical Exam Constitutional:      General: He is not in acute distress. HENT:     Head: Normocephalic and atraumatic.  Eyes:     General: No scleral icterus. Cardiovascular:     Rate and Rhythm: Normal rate and regular rhythm.     Heart sounds: Normal heart sounds.  Pulmonary:     Effort: Pulmonary effort is normal. No respiratory distress.     Breath sounds: No wheezing.  Abdominal:     General: Bowel sounds are normal. There is no distension.     Palpations: Abdomen is soft.  Musculoskeletal:        General: No deformity. Normal range of motion.     Cervical back: Normal range of motion and neck supple.  Skin:    General: Skin is warm and dry.     Findings: No erythema or rash.  Neurological:     Mental Status: He is alert and oriented to person, place, and time. Mental status is at baseline.     Cranial Nerves: No cranial nerve deficit.     Coordination: Coordination normal.  Psychiatric:        Mood and Affect: Mood normal.     LABORATORY DATA:  I have reviewed the data as listed Lab Results  Component Value Date   WBC 3.5 (L) 01/21/2020   HGB 10.8 (L) 01/21/2020   HCT 31.2 (L) 01/21/2020   MCV 86.7 01/21/2020   PLT 152 01/21/2020   Recent Labs    11/26/19 1314 12/24/19 1346 01/21/20 1306  NA 140 141 140  K 3.7 4.0 3.9  CL 108 107  107  CO2 25  25 27  GLUCOSE 104* 108* 109*  BUN 26* 24* 25*  CREATININE 1.70* 1.65* 1.63*  CALCIUM 9.3 9.4 9.0  GFRNONAA 40* 41* 42*  GFRAA 46* 48* 49*  PROT 6.1* 6.3* 6.0*  ALBUMIN 4.0 4.3 3.8  AST 14* 16 17  ALT 13 12 13   ALKPHOS 126 131* 140*  BILITOT 0.7 1.0 0.8   Iron/TIBC/Ferritin/ %Sat    Component Value Date/Time   IRON 53 07/06/2019 0958   TIBC 267 07/06/2019 0958   FERRITIN 880 (H) 07/06/2019 0958   IRONPCTSAT 20 07/06/2019 0958      RADIOGRAPHIC STUDIES: I have personally reviewed the radiological images as listed and agreed with the findings in the report. No results found.    ASSESSMENT & PLAN:  1. Prostate cancer ([place])   2. Increased frequency of urination   3. Prostate cancer metastatic to bone ([facility])   4. 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  Today's PSA has further increased to 136.  Clinically he has developed resistance to current regimen Zytiga and prednison Obtain bone scan.   Effectiveness of Xtandi after [place] is limited.  CT showed mostly metastatic bone diseases. I will refer him to establish care with Radiation Oncology for [facility].    #Frequent urination, patient is on Doxazosin 2 mg daily.  UA showed no evidence of UTI. Discussed with patient we will refer him to establish care with urology for small kidney mass as well as frequent urination.  Referred to urology. Also discussed with Dr.Sninsky for evaluation of possibility of prostate biopsy.   #Androgen deprivation therapy, last Eligard 22.5 mg 12/24/2019, Due in early June.   #Bone metastasis, discussed with patient about the rationale of starting Xgeva.  Awaiting dental clearance.  #Anemia with hemoglobin 10.8  .  # Port A cath, continue port flush 8 to 12 weeks.  Orders Placed This  Encounter  Procedures  . CBC with Differential/Platelet    Standing Status:   Future    Standing Expiration Date:   01/20/2021  . Comprehensive metabolic panel    Standing Status:   Future    Standing Expiration Date:   01/20/2021  . PSA    Standing Status:   Future    Standing Expiration Date:   01/20/2021    All questions were answered. The patient knows to call the clinic with any problems questions or concerns.  cc Bakare, Mobolaji B, MD   Return of visit: 4 weeks.   Earlie Server, MD, PhD Hematology Oncology [facility] Pager- [phone] 01/21/2020

## 2020-01-21 NOTE — Progress Notes (Signed)
Patient reports frequent urination during the night.

## 2020-01-22 ENCOUNTER — Telehealth: Payer: Self-pay

## 2020-01-22 ENCOUNTER — Other Ambulatory Visit: Payer: Self-pay | Admitting: Oncology

## 2020-01-22 DIAGNOSIS — C61 Malignant neoplasm of prostate: Secondary | ICD-10-CM

## 2020-01-22 NOTE — Telephone Encounter (Signed)
Per Elida from Dr. Tasia Catchings: I just talked to him. PSA rapidly doubling. please arrange him to do bone scan ASAP (he lives 3 hours away), refer to [place] for prostate CA, evaluation of xofigo treatments, thanks.

## 2020-01-22 NOTE — Telephone Encounter (Signed)
Done.. Pt has been sched as requested. A detailed message was left on pt vmail making him aware  of location,date and time of his scheduled appt.

## 2020-01-23 ENCOUNTER — Ambulatory Visit: Payer: Medicare HMO | Admitting: Urology

## 2020-01-23 ENCOUNTER — Encounter: Payer: Self-pay | Admitting: Urology

## 2020-01-23 ENCOUNTER — Other Ambulatory Visit: Payer: Self-pay

## 2020-01-23 VITALS — BP 151/87 | HR 82 | Ht 74.1 in | Wt 185.0 lb

## 2020-01-23 DIAGNOSIS — C61 Malignant neoplasm of prostate: Secondary | ICD-10-CM

## 2020-01-23 DIAGNOSIS — N3281 Overactive bladder: Secondary | ICD-10-CM | POA: Diagnosis not present

## 2020-01-23 DIAGNOSIS — N529 Male erectile dysfunction, unspecified: Secondary | ICD-10-CM | POA: Diagnosis not present

## 2020-01-23 DIAGNOSIS — N2889 Other specified disorders of kidney and ureter: Secondary | ICD-10-CM

## 2020-01-23 MED ORDER — TADALAFIL 5 MG PO TABS: 5.0000 mg | ORAL_TABLET | ORAL | 3 refills | Status: AC

## 2020-01-23 NOTE — Patient Instructions (Signed)
1. Minimize fluids after 6pm, urinate right before bed 2. Try to pay attention if any particular tea (Teo or Green) worsen your urinary symptoms  Tadalafil tablets (Cialis) What is this medicine? TADALAFIL (tah DA la fil) is used to treat erection problems in men. It is also used for enlargement of the prostate gland in men, a condition called benign prostatic hyperplasia or BPH. This medicine improves urine flow and reduces BPH symptoms. This medicine can also treat both erection problems and BPH when they occur together. This medicine may be used for other purposes; ask your health care provider or pharmacist if you have questions. COMMON BRAND NAME(S): Kathaleen Bury, Cialis What should I tell my health care provider before I take this medicine? They need to know if you have any of these conditions:  bleeding disorders  eye or vision problems, including a rare inherited eye disease called retinitis pigmentosa  anatomical deformation of the penis, Peyronie's disease, or history of priapism (painful and prolonged erection)  heart disease, angina, a history of heart attack, irregular heart beats, or other heart problems  high or low blood pressure  history of blood diseases, like sickle cell anemia or leukemia  history of stomach bleeding  kidney disease  liver disease  stroke  an unusual or allergic reaction to tadalafil, other medicines, foods, dyes, or preservatives  pregnant or trying to get pregnant  breast-feeding How should I use this medicine? Take this medicine by mouth with a glass of water. Follow the directions on the prescription label. You may take this medicine with or without meals. When this medicine is used for erection problems, your doctor may prescribe it to be taken once daily or as needed. If you are taking the medicine as needed, you may be able to have sexual activity 30 minutes after taking it and for up to 36 hours after taking it. Whether you are  taking the medicine as needed or once daily, you should not take more than one dose per day. If you are taking this medicine for symptoms of benign prostatic hyperplasia (BPH) or to treat both BPH and an erection problem, take the dose once daily at about the same time each day. Do not take your medicine more often than directed. Talk to your pediatrician regarding the use of this medicine in children. Special care may be needed. Overdosage: If you think you have taken too much of this medicine contact a poison control center or emergency room at once. NOTE: This medicine is only for you. Do not share this medicine with others. What if I miss a dose? If you are taking this medicine as needed for erection problems, this does not apply. If you miss a dose while taking this medicine once daily for an erection problem, benign prostatic hyperplasia, or both, take it as soon as you remember, but do not take more than one dose per day. What may interact with this medicine? Do not take this medicine with any of the following medications:  nitrates like amyl nitrite, isosorbide dinitrate, isosorbide mononitrate, nitroglycerin  other medicines for erectile dysfunction like avanafil, sildenafil, vardenafil  other tadalafil products (Adcirca)  riociguat This medicine may also interact with the following medications:  certain drugs for high blood pressure  certain drugs for the treatment of HIV infection or AIDS  certain drugs used for fungal or yeast infections, like fluconazole, itraconazole, ketoconazole, and voriconazole  certain drugs used for seizures like carbamazepine, phenytoin, and phenobarbital  grapefruit juice  macrolide antibiotics like clarithromycin, erythromycin, troleandomycin  medicines for prostate problems  rifabutin, rifampin or rifapentine This list may not describe all possible interactions. Give your health care provider a list of all the medicines, herbs,  non-prescription drugs, or dietary supplements you use. Also tell them if you smoke, drink alcohol, or use illegal drugs. Some items may interact with your medicine. What should I watch for while using this medicine? If you notice any changes in your vision while taking this drug, call your doctor or health care professional as soon as possible. Stop using this medicine and call your health care provider right away if you have a loss of sight in one or both eyes. Contact your doctor or health care professional right away if the erection lasts longer than 4 hours or if it becomes painful. This may be a sign of serious problem and must be treated right away to prevent permanent damage. If you experience symptoms of nausea, dizziness, chest pain or arm pain upon initiation of sexual activity after taking this medicine, you should refrain from further activity and call your doctor or health care professional as soon as possible. Do not drink alcohol to excess (examples, 5 glasses of wine or 5 shots of whiskey) when taking this medicine. When taken in excess, alcohol can increase your chances of getting a headache or getting dizzy, increasing your heart rate or lowering your blood pressure. Using this medicine does not protect you or your partner against HIV infection (the virus that causes AIDS) or other sexually transmitted diseases. What side effects may I notice from receiving this medicine? Side effects that you should report to your doctor or health care professional as soon as possible:  allergic reactions like skin rash, itching or hives, swelling of the face, lips, or tongue  breathing problems  changes in hearing  changes in vision  chest pain  fast, irregular heartbeat  prolonged or painful erection  seizures Side effects that usually do not require medical attention (report to your doctor or health care professional if they continue or are bothersome):  back  pain  dizziness  flushing  headache  indigestion  muscle aches  nausea  stuffy or runny nose This list may not describe all possible side effects. Call your doctor for medical advice about side effects. You may report side effects to FDA at 1-800-FDA-1088. Where should I keep my medicine? Keep out of the reach of children. Store at room temperature between 15 and 30 degrees C (59 and 86 degrees F). Throw away any unused medicine after the expiration date. NOTE: This sheet is a summary. It may not cover all possible information. If you have questions about this medicine, talk to your doctor, pharmacist, or health care provider.  2020 Elsevier/Gold Standard (2014-03-01 13:15:49)

## 2020-01-23 NOTE — Progress Notes (Signed)
01/23/20 4:29 PM   Tyler Stevenson Mar 24, 1951 [id number]  CC: Metastatic prostate cancer, erectile dysfunction, urinary symptoms and frequency  HPI: I saw Tyler Stevenson in urology clinic today for the above issues.  He is a 68 year old African-American male with history notable for metastatic prostate cancer.  He is a poor historian and he has received oncologic care and multiple states which make obtaining an accurate timeline of his diagnosis and history very challenging.  Please see Dr. Collie Siad note from 01/21/2020 for full oncologic details.  To briefly summarize, he reports he was diagnosed in October 2016 and treated with brachytherapy at [facility] in [place].  His original Gleason score and pathology is unavailable to me.  He ultimately developed PSA recurrence and was started on ADT at the [place].  As recently, he has been under the care of Jay and treated with ADT, antiandrogens, and most recently Taxotere chemotherapy.  His cancer treatment is being managed by oncology at this time, and most recent PSA is 137.  His most recent imaging with a CT abdomen pelvis on 12/06/2019 shows widespread/diffuse sclerotic osseous metastatic disease, not appreciably changed from a bone scan in October 2020.  There is no adenopathy identified or hydronephrosis.  There was also a ~1 cm complex left upper pole renal cyst versus mass.  Renal ultrasound showed no hydronephrosis, does did not demonstrate Doppler flow of this lesion, but it was not definitely cystic.  The prostate volume was 34 mL.  His primary complaints today are difficulty with erections is been going on for many years, he has never tried any medications for this.  He is also bothered by urinary frequency, primarily nocturia 2-4 times per night.  He is on doxazosin.  He was previously on Flomax with no improvement in his urinary symptoms.  He denies any gross hematuria.  Notably, he takes  dozens of supplements throughout the day that he brought with him in a large grocery bag today, as well as Nettle tea and green tea that he drinks throughout the day.  Urinalysis today benign with 6-10 WBCs, 0 RBCs, no bacteria, nitrite negative.  PVR normal at 3 mL.  He is also been evaluated by genetics.   PMH: Past Medical History:  Diagnosis Date  . Family history of breast cancer   . Family history of prostate cancer   . Family history of skin cancer   . Prostate cancer [facility])     Surgical History: Past Surgical History:  Procedure Laterality Date  . ANAL FISSURE REPAIR    . HEMORROIDECTOMY     Family History: Family History  Problem Relation Age of Onset  . Skin cancer Father        metastasized to brain  . Breast cancer Sister 90  . Prostate cancer Paternal Grandfather 35  . Throat cancer Brother 30  . Prostate cancer Maternal Grandfather 54    Social History:  reports that he has never smoked. He has never used smokeless tobacco. He reports that he does not drink alcohol or use drugs.  Physical Exam: BP (!) 151/87   Pulse 82   Ht 6' 2.1" (1.882 m)   Wt 185 lb (83.9 kg)   BMI 23.69 kg/m    Constitutional:  Alert and oriented, No acute distress. Cardiovascular: No clubbing, cyanosis, or edema. Respiratory: Normal respiratory effort, no increased work of breathing. GI: Abdomen is soft, nontender, nondistended, no abdominal masses GU: Circumcised phallus with patent meatus, no lesions.  Testicles atrophied bilaterally consistent with ADT. Lymph: No cervical or inguinal lymphadenopathy. Skin: No rashes, bruises or suspicious lesions. Neurologic: Grossly intact, no focal deficits, moving all 4 extremities. Psychiatric: Normal mood and affect.  Laboratory Data: Reviewed, see HPI  Pertinent Imaging: I personally reviewed the imaging, see HPI for details  Assessment & Plan:   In summary, he is a 69 year old African-American male with metastatic prostate  cancer, with extensive osseous metastatic disease.  He has undergone a number of treatments at different locations which make piecing together the timeline of his history very challenging.  He is currently under the care of Dr. Tasia Catchings at the cancer center here at [facility].  I do not feel there is any benefit to repeating a prostate biopsy at this time in the setting of extensive ADT and prior brachytherapy, as not only will pathology and Gleason score be inaccurate with those prior treatments, I do not feel it would change his management.  Regarding his urinary symptoms, I was very frank with the patient that I think his large consumption of tea and supplements is the major factor for his nocturia and his frequency.  He is emptying his bladder well today and urinalysis is benign.  I recommended starting with behavioral strategies including minimizing supplements in the diet, minimizing tea, minimizing fluids after 6 PM, and double voiding prior to bed.  If he continues to have refractory symptoms despite these strategies, could consider cystoscopy and consider an outlet procedure in the future.  Regarding his erectile dysfunction, I recommended a trial of Cialis.  We discussed the risk, benefits, and alternatives of this medication at length.   Finally, regarding his small renal mass, I recommended repeating a renal ultrasound in 6 months to evaluate for any change.  He cannot get contrast with his CKD for a CT, but could consider an MRI in the future if enlarging on repeat ultrasound in 6 months.  -Continue doxazosin -Trial of Cialis 10 mg every other day for ED -No role for prostate biopsy -Behavioral strategies regarding urinary symptoms discussed at length -Repeat renal ultrasound in 6 months regarding small 1cm renal lesion -Virtual visit 2 months for symptom check regarding urination and ED  I spent 60 total minutes on the day of the encounter including pre-visit review of the medical  record, face-to-face time with the patient, and post visit ordering of labs/imaging/tests.  Nickolas Madrid, MD 01/23/2020  [facility] [address] [place], [place] [address] [phone]

## 2020-01-24 ENCOUNTER — Other Ambulatory Visit: Payer: Self-pay | Admitting: Oncology

## 2020-01-24 ENCOUNTER — Telehealth: Payer: Self-pay

## 2020-01-24 ENCOUNTER — Encounter: Payer: Self-pay | Admitting: Radiation Oncology

## 2020-01-24 ENCOUNTER — Other Ambulatory Visit: Payer: Self-pay

## 2020-01-24 LAB — URINALYSIS, COMPLETE
Bilirubin, UA: NEGATIVE
Glucose, UA: NEGATIVE
Leukocytes,UA: NEGATIVE
Nitrite, UA: NEGATIVE
RBC, UA: NEGATIVE
Specific Gravity, UA: 1.025 (ref 1.005–1.030)
Urobilinogen, Ur: 0.2 mg/dL (ref 0.2–1.0)
pH, UA: 6 (ref 5.0–7.5)

## 2020-01-24 LAB — MICROSCOPIC EXAMINATION
Bacteria, UA: NONE SEEN
RBC, Urine: NONE SEEN /hpf (ref 0–2)

## 2020-01-24 MED ORDER — MELATONIN 5 MG PO CAPS: 1.0000 | ORAL_CAPSULE | Freq: Every evening | ORAL | 0 refills | Status: DC | PRN

## 2020-01-24 NOTE — Telephone Encounter (Signed)
-----   Message from Manus Rudd, RN sent at 01/24/2020  3:45 PM EDT ----- Regarding: Sleeping Medication Patient states that he requested medication to help him sleep and has not received a response.

## 2020-01-24 NOTE — Telephone Encounter (Signed)
Patient is requesting medication to help him sleep.

## 2020-01-24 NOTE — Telephone Encounter (Signed)
Advise pt to start melatonin. Rx will be sent to his pharmacy.

## 2020-01-25 ENCOUNTER — Encounter: Payer: Self-pay | Admitting: Radiation Oncology

## 2020-01-25 ENCOUNTER — Ambulatory Visit
Admission: RE | Admit: 2020-01-25 | Discharge: 2020-01-25 | Disposition: A | Discharge status: Home / Self Care | Payer: Medicare HMO | Source: Ambulatory Visit | Attending: Radiation Oncology | Admitting: Radiation Oncology

## 2020-01-25 VITALS — BP 162/97 | HR 69 | Temp 95.2°F | Resp 20 | Wt 182.6 lb

## 2020-01-25 DIAGNOSIS — C61 Malignant neoplasm of prostate: Secondary | ICD-10-CM | POA: Insufficient documentation

## 2020-01-25 DIAGNOSIS — Z8042 Family history of malignant neoplasm of prostate: Secondary | ICD-10-CM | POA: Insufficient documentation

## 2020-01-25 DIAGNOSIS — Z803 Family history of malignant neoplasm of breast: Secondary | ICD-10-CM | POA: Diagnosis not present

## 2020-01-25 DIAGNOSIS — C7951 Secondary malignant neoplasm of bone: Secondary | ICD-10-CM | POA: Diagnosis not present

## 2020-01-25 DIAGNOSIS — Z79899 Other long term (current) drug therapy: Secondary | ICD-10-CM | POA: Diagnosis not present

## 2020-01-25 DIAGNOSIS — N1831 Chronic kidney disease, stage 3a: Secondary | ICD-10-CM | POA: Diagnosis not present

## 2020-01-25 DIAGNOSIS — Z192 Hormone resistant malignancy status: Secondary | ICD-10-CM | POA: Diagnosis not present

## 2020-01-25 DIAGNOSIS — Z808 Family history of malignant neoplasm of other organs or systems: Secondary | ICD-10-CM | POA: Diagnosis not present

## 2020-01-25 NOTE — Telephone Encounter (Signed)
Message left informing patient

## 2020-01-25 NOTE — Consult Note (Signed)
NEW PATIENT EVALUATION  Name: Tyler Stevenson  MRN: [id number]  Date:   01/25/2020     DOB: 22-Jun-1951   This 69 y.o. male patient presents to the clinic for initial evaluation of castrate resistant stage IV prostate cancer for [facility] treatment.  REFERRING PHYSICIAN: Audley Hose, MD  CHIEF COMPLAINT:  Chief Complaint  Patient presents with  . Prostate Cancer    DIAGNOSIS: The encounter diagnosis was Prostate cancer ([place]).   PREVIOUS INVESTIGATIONS:  Bone scan and CT scans reviewed Clinical notes reviewed   HPI: Patient is a 69 year old male with known castrate resistant adenocarcinoma of the prostate.  He was originally diagnosed around June 2019 when he was started on Eligard PSA was in the 3000 range.  He is also been treated with Xgeva around that time.  His PSA began to rise and proxy 1 year prior he was switched to Zytiga and prednisone.  In May 2020 his PSA rose to 324 was started on Taxotere treatments and a port was placed.  All this was done out of state and he recently moved back to [place].  Bone scan back in September 2020 showed widespread metastatic disease.  In February 2020 when he had CT scan of chest abdomen pelvis again showing widespread sclerotic osseous metastatic disease.  His PSA 4 days ago was 136 up from 75 a month ago.  Patient also has known stage IIIa chronic renal disease.  He is now referred to radiation oncology for consideration of Xofigo treatment.  He is doing fairly well.  Does have bone pain although he is ambulating well.  He does have apparently have on bone scan bilateral hip involvement.  PLANNED TREATMENT REGIMEN: Xofigo  PAST MEDICAL HISTORY:  has a past medical history of Family history of breast cancer, Family history of prostate cancer, Family history of skin cancer, and Prostate cancer ([place]).    PAST SURGICAL HISTORY:  Past Surgical History:  Procedure Laterality Date  . ANAL FISSURE REPAIR    . HEMORROIDECTOMY      FAMILY  HISTORY: family history includes Breast cancer (age of onset: 62) in his sister; Prostate cancer (age of onset: 78) in his maternal grandfather; Prostate cancer (age of onset: 84) in his paternal grandfather; Skin cancer in his father; Throat cancer (age of onset: 71) in his brother.  SOCIAL HISTORY:  reports that he has never smoked. He has never used smokeless tobacco. He reports that he does not drink alcohol or use drugs.  ALLERGIES: Histamine  MEDICATIONS:  Current Outpatient Medications  Medication Sig Dispense Refill  . abiraterone acetate (ZYTIGA) 250 MG tablet TAKE 4 TABLETS (1,000 MG TOTAL) BY MOUTH DAILY. TAKE ON AN EMPTY STOMACH 1 HOUR BEFORE OR 2 HOURS AFTER A MEAL 120 tablet 0  . acetaminophen (TYLENOL 8 HOUR) 650 MG CR tablet Take 650 mg by mouth every 8 (eight) hours as needed for pain.    Marland Kitchen acetaminophen-codeine (TYLENOL #3) 300-30 MG tablet Take 1 tablet by mouth every 12 (twelve) hours as needed for moderate pain or severe pain. 30 tablet 0  . APPLE CIDER VINEGAR PO Take by mouth.    . Ascorbic Acid (VITAMIN C) 1000 MG tablet Take 1,000 mg by mouth daily. Vitamin c Crystals with Calcium 110mg .    Marland Kitchen BETA CAROTENE PO Take 7,500 mcg by mouth daily.    . Biotin 1000 MCG tablet Take 1,000 mcg by mouth 2 (two) times daily.    . Coenzyme Q10 (COQ10 PO) Take by  mouth.    . doxazosin (CARDURA) 2 MG tablet TAKE 1 TABLET BY MOUTH EVERY DAY 90 tablet 1  . ergocalciferol (VITAMIN D2) 1.25 MG (50000 UT) capsule Take 5,000 Units by mouth 2 (two) times daily.    Marland Kitchen gabapentin (NEURONTIN) 300 MG capsule Take 300 mg by mouth at bedtime.    . Garlic 123XX123 MG CAPS Take 1,000 mg by mouth 2 (two) times daily.    . Ginger, Zingiber officinalis, (GINGER ROOT) 550 MG CAPS Take 1 capsule by mouth daily.    Javier Docker Oil (OMEGA-3) 500 MG CAPS Take 1 capsule by mouth daily.    Marland Kitchen LYCOPENE PO Take by mouth. vitamin    . Magnesium 300 MG CAPS Take by mouth 2 (two) times daily.    . Melatonin 5 MG CAPS Take  1 capsule (5 mg total) by mouth at bedtime as needed. 30 capsule 0  . multivitamin-lutein (OCUVITE-LUTEIN) CAPS capsule Take 1 capsule by mouth daily.    . predniSONE (DELTASONE) 5 MG tablet Take 1 tablet (5 mg total) by mouth 2 (two) times daily. 60 tablet 1  . tadalafil (CIALIS) 5 MG tablet Take 1 tablet (5 mg total) by mouth every other day. 30 tablet 3   No current facility-administered medications for this encounter.    ECOG PERFORMANCE STATUS:  1 - Symptomatic but completely ambulatory  REVIEW OF SYSTEMS: Patient denies any weight loss, fatigue, weakness, fever, chills or night sweats. Patient denies any loss of vision, blurred vision. Patient denies any ringing  of the ears or hearing loss. No irregular heartbeat. Patient denies heart murmur or history of fainting. Patient denies any chest pain or pain radiating to her upper extremities. Patient denies any shortness of breath, difficulty breathing at night, cough or hemoptysis. Patient denies any swelling in the lower legs. Patient denies any nausea vomiting, vomiting of blood, or coffee ground material in the vomitus. Patient denies any stomach pain. Patient states has had normal bowel movements no significant constipation or diarrhea. Patient denies any dysuria, hematuria or significant nocturia. Patient denies any problems walking, swelling in the joints or loss of balance. Patient denies any skin changes, loss of hair or loss of weight. Patient denies any excessive worrying or anxiety or significant depression. Patient denies any problems with insomnia. Patient denies excessive thirst, polyuria, polydipsia. Patient denies any swollen glands, patient denies easy bruising or easy bleeding. Patient denies any recent infections, allergies or URI. Patient "s visual fields have not changed significantly in recent time.   PHYSICAL EXAM: BP (!) 162/97   Pulse 69   Temp (!) 95.2 F (35.1 C) (Tympanic)   Resp 20   Wt 182 lb 9.6 oz (82.8 kg)    BMI 23.38 kg/m  Range of motion of the lower extremities not elicit pain motor or sensory and DTR levels are equal symmetric in lower extremities.  Well-developed well-nourished patient in NAD. HEENT reveals PERLA, EOMI, discs not visualized.  Oral cavity is clear. No oral mucosal lesions are identified. Neck is clear without evidence of cervical or supraclavicular adenopathy. Lungs are clear to A&P. Cardiac examination is essentially unremarkable with regular rate and rhythm without murmur rub or thrill. Abdomen is benign with no organomegaly or masses noted. Motor sensory and DTR levels are equal and symmetric in the upper and lower extremities. Cranial nerves II through XII are grossly intact. Proprioception is intact. No peripheral adenopathy or edema is identified. No motor or sensory levels are noted. Crude visual fields are  within normal range.  LABORATORY DATA: Lab values are reviewed    RADIOLOGY RESULTS: Bone scan CT scans abdomen chest and pelvis all reviewed compatible with above-stated findings   IMPRESSION: Present time patient has obvious castrate resistant stage IV prostate cancer.  I recommended Xofigo 6 infusions over 6 months.  Risks and benefits of treatment including possible allergic reaction possible nausea diarrhea and bone marrow suppression all were discussed in detail with the patient.  We will obtain approval to deliver his first dose and start his CBC monitoring prior to each infusion.  Patient has excepted treatment.  I would also use spot external beam radiation therapy should any areas become significantly painful.  I would like to take this opportunity to thank you for allowing me to participate in the care of your patient.Marland Kitchen  PLAN: As above  Noreene Filbert, MD

## 2020-01-28 ENCOUNTER — Ambulatory Visit
Admission: RE | Admit: 2020-01-28 | Discharge: 2020-01-28 | Disposition: A | Discharge status: Home / Self Care | Payer: Medicare HMO | Source: Ambulatory Visit | Attending: Oncology | Admitting: Oncology

## 2020-01-28 ENCOUNTER — Encounter
Admission: RE | Admit: 2020-01-28 | Discharge: 2020-01-28 | Disposition: A | Discharge status: Home / Self Care | Payer: Medicare HMO | Source: Ambulatory Visit | Attending: Oncology | Admitting: Oncology

## 2020-01-28 ENCOUNTER — Other Ambulatory Visit: Payer: Self-pay

## 2020-01-28 DIAGNOSIS — C61 Malignant neoplasm of prostate: Secondary | ICD-10-CM | POA: Insufficient documentation

## 2020-01-28 MED ORDER — TECHNETIUM TC 99M MEDRONATE IV KIT
20.0000 | PACK | Freq: Once | INTRAVENOUS | Status: AC | PRN
  Administered 2020-01-28: 22.196 via INTRAVENOUS

## 2020-01-29 ENCOUNTER — Other Ambulatory Visit: Payer: Self-pay | Admitting: *Deleted

## 2020-01-29 ENCOUNTER — Other Ambulatory Visit: Payer: Self-pay | Admitting: Radiation Oncology

## 2020-01-29 DIAGNOSIS — C61 Malignant neoplasm of prostate: Secondary | ICD-10-CM

## 2020-02-15 ENCOUNTER — Telehealth: Payer: Self-pay | Admitting: *Deleted

## 2020-02-15 NOTE — Telephone Encounter (Signed)
Per pt request on 02/15/20 to cancel his 02/19/20 port flush with lab/MD visit. Pt stated that he would contact the office when he was ready to have appts R/S.

## 2020-02-16 ENCOUNTER — Other Ambulatory Visit: Payer: Self-pay | Admitting: Oncology

## 2020-02-18 ENCOUNTER — Ambulatory Visit: Payer: Medicare HMO | Admitting: Oncology

## 2020-02-19 ENCOUNTER — Inpatient Hospital Stay: Payer: Medicare HMO | Admitting: Oncology

## 2020-02-19 ENCOUNTER — Inpatient Hospital Stay: Payer: Medicare HMO

## 2020-02-21 ENCOUNTER — Inpatient Hospital Stay: Payer: Medicare HMO

## 2020-02-27 ENCOUNTER — Ambulatory Visit: Payer: Medicare HMO

## 2020-02-27 ENCOUNTER — Ambulatory Visit: Payer: Medicare HMO | Admitting: Radiation Oncology

## 2020-03-17 ENCOUNTER — Telehealth: Payer: Self-pay | Admitting: *Deleted

## 2020-03-17 NOTE — Telephone Encounter (Signed)
Per Ana,  Pt stated to Cx all appointments here for now. He's currently living in [place] and is having some issues with pain. He is trying to seek care in [place]. Pt was told to contact the office to R/S appts if needed.

## 2020-03-20 ENCOUNTER — Inpatient Hospital Stay: Payer: Medicare HMO

## 2020-03-26 ENCOUNTER — Ambulatory Visit: Payer: Medicare HMO | Admitting: Radiation Oncology

## 2020-03-26 ENCOUNTER — Ambulatory Visit: Payer: Medicare HMO | Admitting: Oncology

## 2020-03-26 ENCOUNTER — Ambulatory Visit: Payer: Medicare HMO

## 2020-03-26 ENCOUNTER — Other Ambulatory Visit: Payer: Medicare HMO

## 2020-03-27 ENCOUNTER — Encounter: Payer: Self-pay | Admitting: Urology

## 2020-03-27 ENCOUNTER — Other Ambulatory Visit: Payer: Self-pay

## 2020-03-27 ENCOUNTER — Telehealth (INDEPENDENT_AMBULATORY_CARE_PROVIDER_SITE_OTHER): Payer: Medicare HMO | Admitting: Urology

## 2020-03-27 NOTE — Progress Notes (Deleted)
Patient ID: Tyler Stevenson, male   DOB: 16-Apr-1951, 69 y.o.   MRN: [id number] This service is provided via telemedicine  No vital signs collected/recorded due to the encounter was a telemedicine visit.   Location of patient (ex: home, work):  HOME  Patient consents to a telephone visit:  YES  Location of the provider (ex: office, home):  OFFICE  Name of any referring provider:  Latanya Presser, MD  Names of all persons participating in the telemedicine service and their role in the encounter:  PATIENT, [name], CMA, Nickolas Madrid, MD  Time spent on call:  ***

## 2020-03-27 NOTE — Progress Notes (Signed)
Patient has moved to [place] and will be seeing a urologist in [place]. Visit will be canceled.

## 2020-04-17 ENCOUNTER — Inpatient Hospital Stay: Payer: Medicare HMO

## 2020-04-23 ENCOUNTER — Ambulatory Visit: Payer: Medicare HMO | Admitting: Radiation Oncology

## 2020-04-23 ENCOUNTER — Ambulatory Visit: Payer: Medicare HMO

## 2020-05-15 ENCOUNTER — Inpatient Hospital Stay: Payer: Medicare HMO

## 2020-05-21 ENCOUNTER — Ambulatory Visit: Payer: Medicare HMO

## 2020-05-21 ENCOUNTER — Ambulatory Visit: Payer: Medicare HMO | Admitting: Radiation Oncology

## 2020-06-12 ENCOUNTER — Inpatient Hospital Stay: Payer: Medicare HMO

## 2020-06-18 ENCOUNTER — Ambulatory Visit: Payer: Medicare HMO | Admitting: Radiation Oncology

## 2020-06-18 ENCOUNTER — Ambulatory Visit: Payer: Medicare HMO

## 2020-07-10 ENCOUNTER — Inpatient Hospital Stay: Payer: Medicare HMO

## 2020-07-16 ENCOUNTER — Ambulatory Visit: Payer: Medicare HMO | Admitting: Radiation Oncology

## 2020-07-16 ENCOUNTER — Ambulatory Visit: Payer: Medicare HMO

## 2020-07-30 ENCOUNTER — Ambulatory Visit: Payer: Self-pay | Admitting: Urology

## 2020-07-31 ENCOUNTER — Encounter: Payer: Self-pay | Admitting: Urology

## 2020-08-07 ENCOUNTER — Other Ambulatory Visit: Payer: Medicare HMO

## 2020-08-13 ENCOUNTER — Ambulatory Visit: Payer: Medicare HMO | Admitting: Radiation Oncology

## 2020-08-13 ENCOUNTER — Ambulatory Visit: Payer: Medicare HMO

## 2020-09-24 DEATH — deceased

## 2021-04-24 IMAGING — CT CT ABD-PELV W/O CM
2 of 4 series · 13 of 46 positions shown, 15 images · non-contrast
Comparison: Multiple exams, including CT abdomen from 08/03/2019
and renal ultrasound from 11/29/2019

CLINICAL DATA: Prostate cancer restaging

EXAM:
CT CHEST, ABDOMEN AND PELVIS WITHOUT CONTRAST
TECHNIQUE: Multidetector CT imaging of the chest, abdomen and pelvis was
performed following the standard protocol without IV contrast.

[Series 2: axials cap 5.00 · axial · 0.78mm/px · z∈[-1484,-934]mm · 10 of 130 slices shown, 12 images]
[im 10/130  soft-tissue]
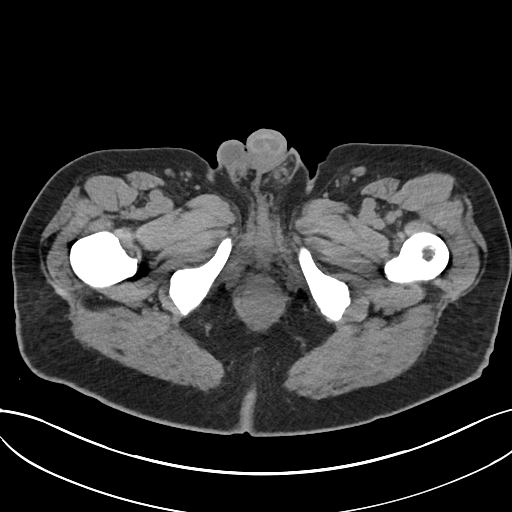
[im 10/130  bone]
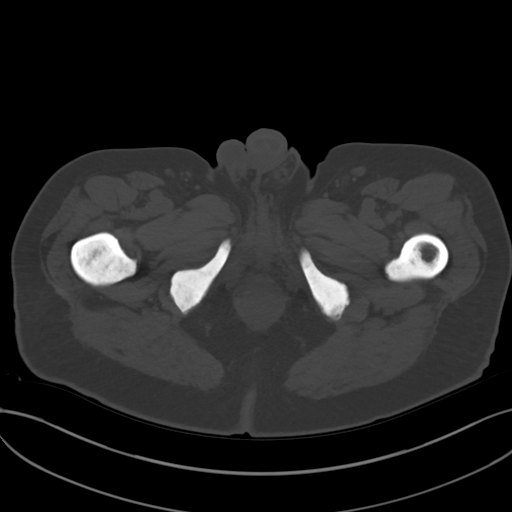
[im 20/130  soft-tissue]
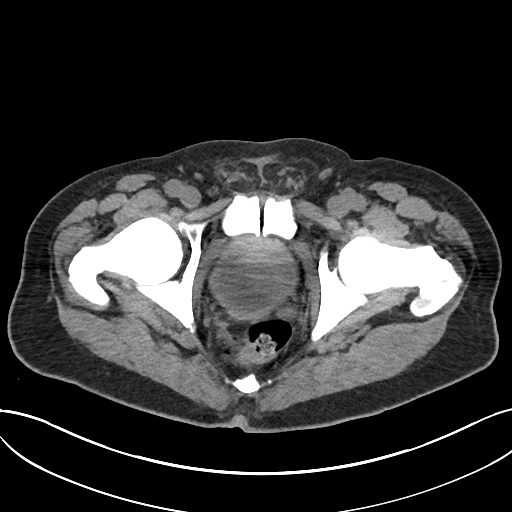
[im 40/130  soft-tissue]
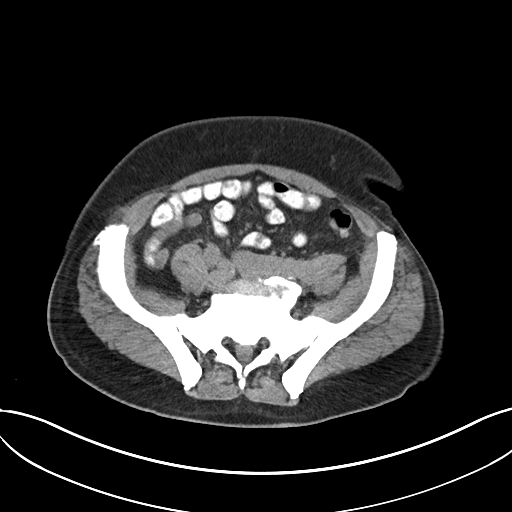
[im 50/130  soft-tissue]
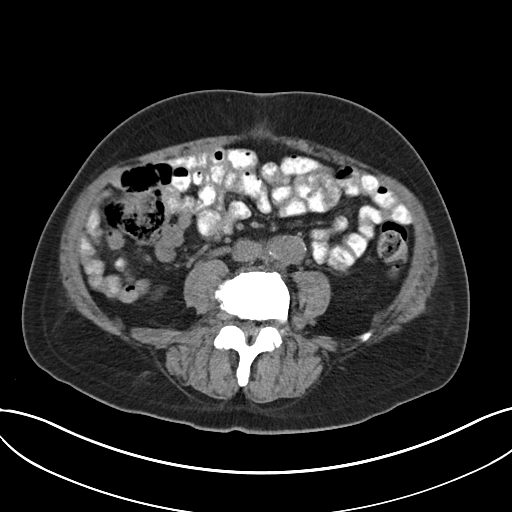
[im 60/130  soft-tissue]
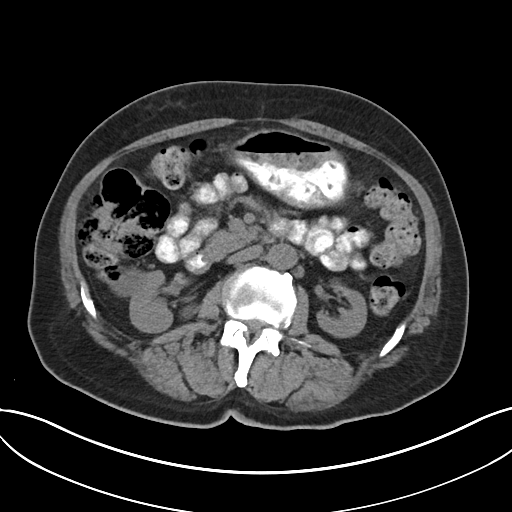
[im 70/130  soft-tissue]
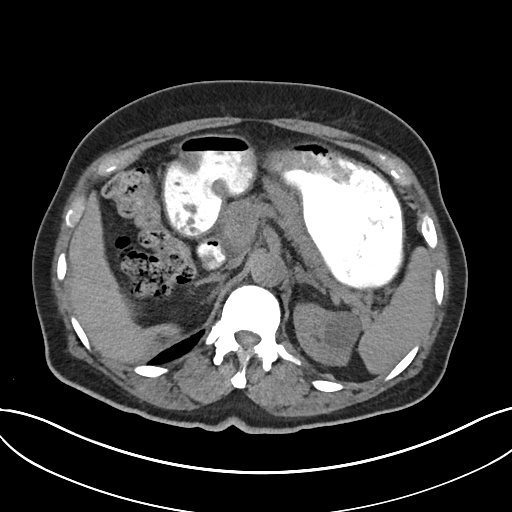
[im 80/130  soft-tissue]
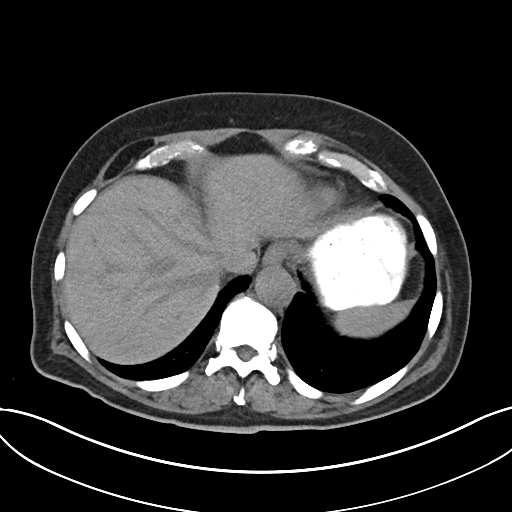
[im 100/130  soft-tissue]
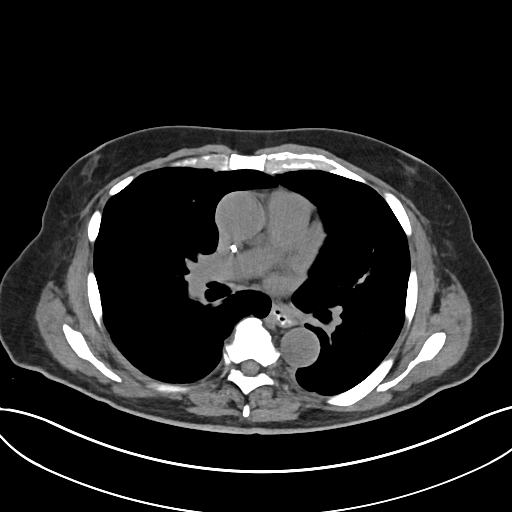
[im 110/130  soft-tissue]
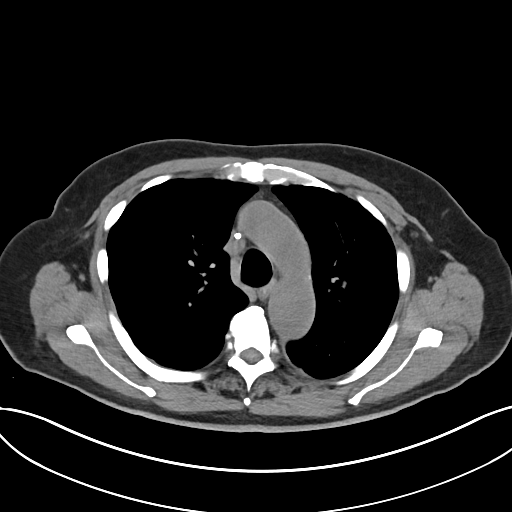
[im 110/130  bone]
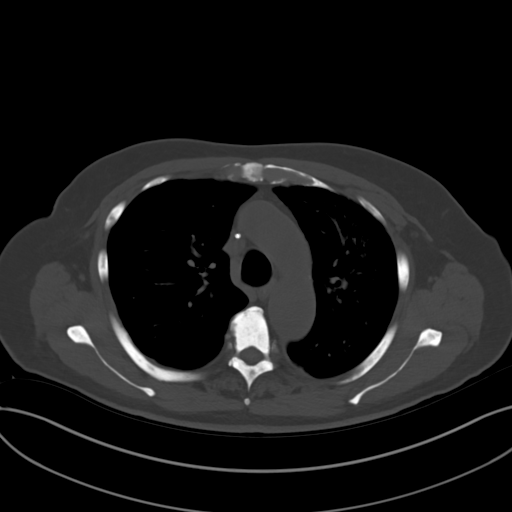
[im 120/130  soft-tissue]
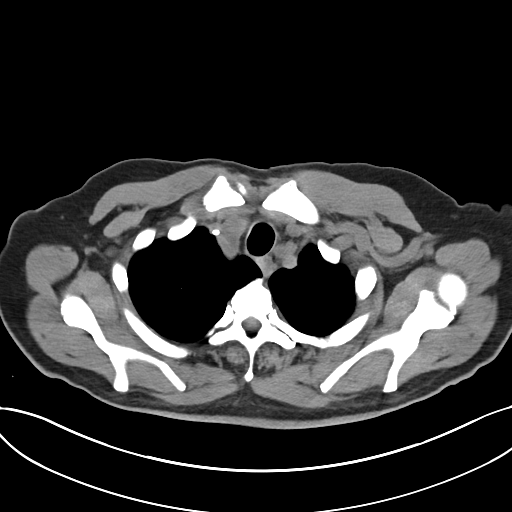

[Series 4: coronals cap 2.00 cor · coronal · 0.78mm/px · 3 of 141 slices shown]
[im 47/141  soft-tissue]
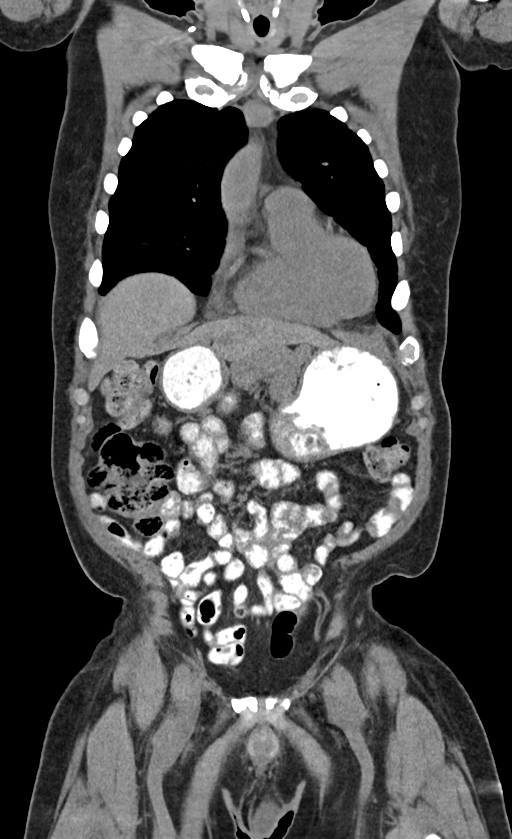
[im 63/141  soft-tissue]
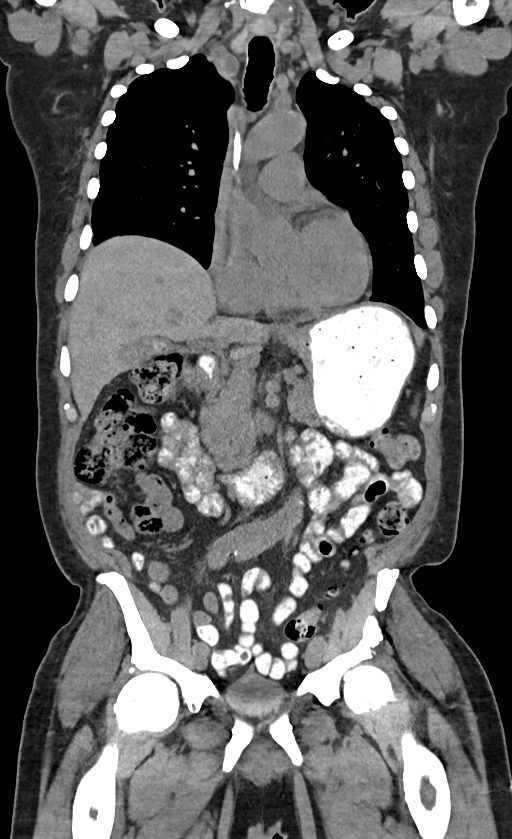
[im 78/141  soft-tissue]
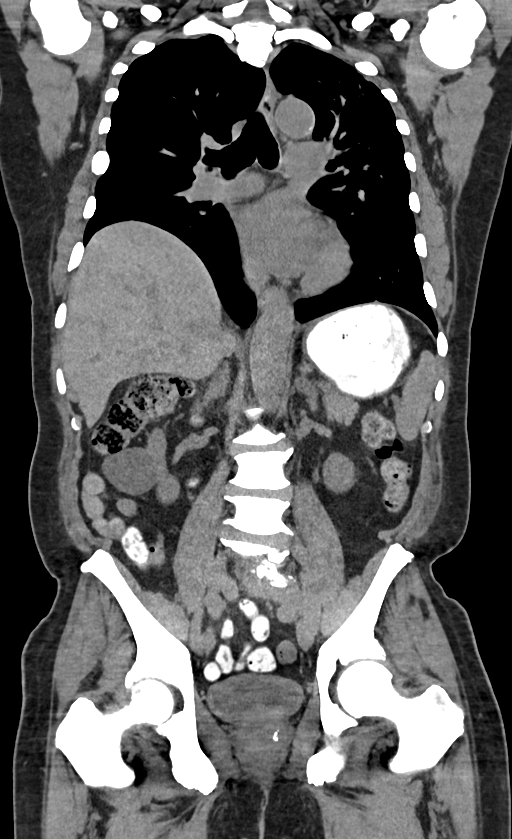

[13 of 46 positions shown; findings below may reference images not displayed]

FINDINGS: CT CHEST FINDINGS

Cardiovascular: Right Port-A-Cath tip: Right atrium.

Atherosclerotic calcification of the thoracic aorta and left
anterior descending coronary artery.

Mediastinum/Nodes: Unremarkable

Lungs/Pleura: Mild scarring or atelectasis along the right major
fissure with very faint ground-glass opacities in the right lung.
Patchy mostly central ground-glass densities in the left upper lobe
and left lower lobe are ill-defined; in the left upper lobe this
extends as a bandlike region of scarring or atelectasis to the apex.
The ground-glass opacities at the lung bases appear similar to the
08/03/2019 CT abdomen. Mild cylindrical bronchiectasis in both
lungs.

Musculoskeletal: Skeletal sclerosis diffuse compatible with diffuse
widespread osseous metastatic disease.

CT ABDOMEN PELVIS FINDINGS

Hepatobiliary: Gallstones are present in the gallbladder with
nitrogen gas phenomenon. Gallbladder wall thickening is present but
may simply be due to nondistention.

Pancreas: Unremarkable

Spleen: Unremarkable

Adrenals/Urinary Tract: Hypodense right kidney lower pole 4.1 by
cm lesion, internal density 13 Hounsfield units.

Hypodense exophytic left kidney upper pole 3.2 by 2.8 cm lesion,
internal density 12 Hounsfield units.

There is a 0.9 cm partially exophytic lobulation of the left kidney
upper pole posteriorly on image 62/2, density similar to the
adjacent renal parenchyma.

Mildly hypodense 0.9 cm lesion of the left kidney lower pole on
image 74/2, internal density 8 Hounsfield units.

There is scarring in the left kidney which contributes to a slightly
lobulated appearance.

Stomach/Bowel: Scattered sigmoid colon diverticula. Upper normal
amount of stool in the colon.

Vascular/Lymphatic: Aortoiliac atherosclerotic vascular disease. No
pathologic adenopathy identified.

Reproductive: Curvilinear likely dystrophic calcification along the
left side of the prostate gland, not changed from prior.

Other: No supplemental non-categorized findings.

Musculoskeletal: Diffuse skeletal sclerosis compatible with diffuse
osseous metastatic disease, not changed from prior.

Multilevel impingement in the lumbar spine due to spondylosis and
degenerative disc disease.
IMPRESSION: 1. Widespread/diffuse sclerotic osseous metastatic disease, not
appreciably changed from prior.
2. No adenopathy identified.
3. Patchy ground-glass opacities in the lungs, left greater than
right, with some confluent scarring in the left upper lobe. At the
lung bases, this process appears similar to the 08/03/2019 exam.
Possibilities might include hypersensitivity pneumonitis,
obstructive bronchiolitis, or nonspecific interstitial pneumonia.
4. Several renal hypodense lesions are likely cysts. Concern has
been raised about a complex lesion of the left kidney upper pole on
prior ultrasound; on today's exam the only corroborating candidate
lesion is the 0.9 cm partially exophytic lobulation of the left
kidney upper pole posteriorly. This could be a complex cyst or a
small mass; contrast enhancement is not interrogated on today's
noncontrast examination. Presuming the patient is unable to receive
IV contrast, surveillance is likely warranted.
5. Other imaging findings of potential clinical significance:
Coronary atherosclerosis. Cholelithiasis. Scattered sigmoid colon
diverticula. Multilevel impingement in the lumbar spine due to
spondylosis and degenerative disc disease.

## 2021-04-24 IMAGING — CT CT CHEST W/O CM
2 of 4 series · 12 of 36 positions shown, 15 images · non-contrast
Comparison: Multiple exams, including CT abdomen from 08/03/2019
and renal ultrasound from 11/29/2019

CLINICAL DATA: Prostate cancer restaging

EXAM:
CT CHEST, ABDOMEN AND PELVIS WITHOUT CONTRAST
TECHNIQUE: Multidetector CT imaging of the chest, abdomen and pelvis was
performed following the standard protocol without IV contrast.

[Series 2: axials cap 5.00 · axial · 0.78mm/px · z∈[-1474,-944]mm · 9 of 130 slices shown, 12 images]
[im 12/130  mediastinal]
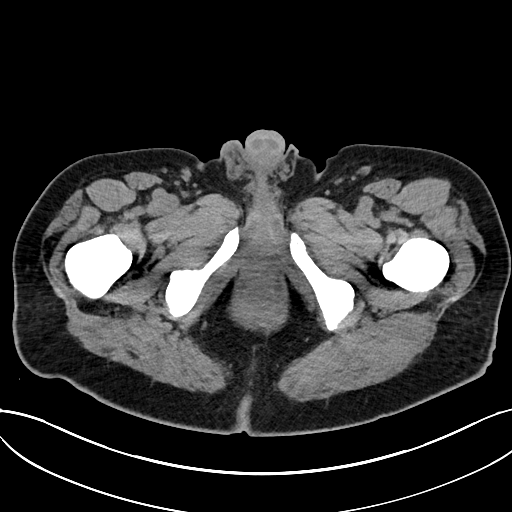
[im 12/130  lung]
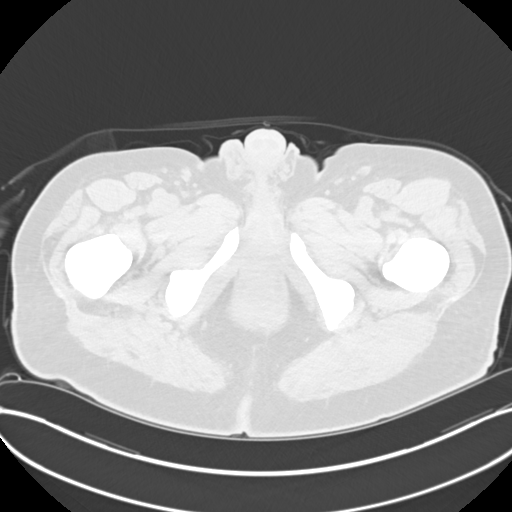
[im 24/130  lung]
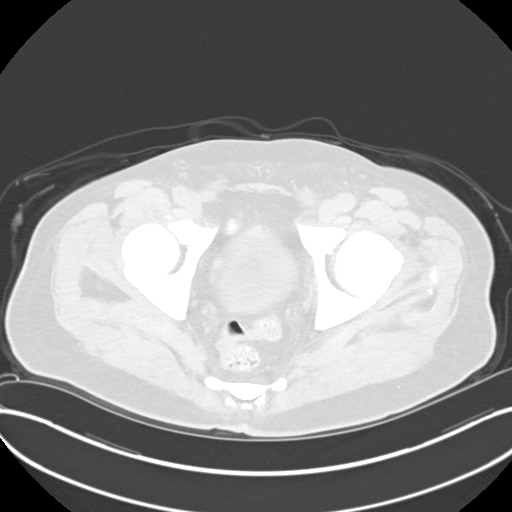
[im 36/130  lung]
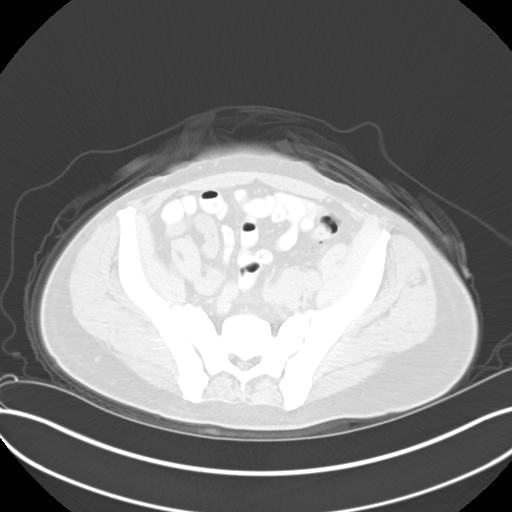
[im 47/130  lung]
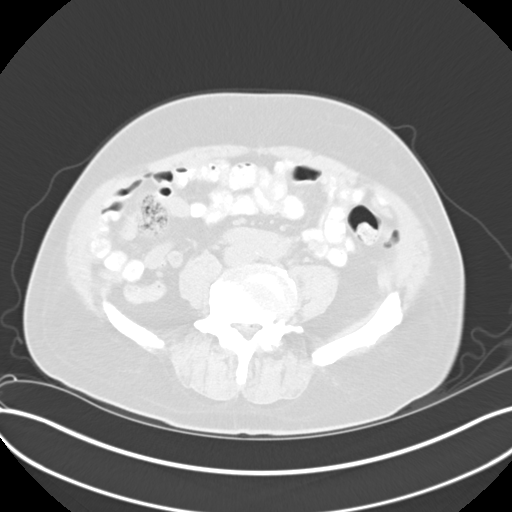
[im 71/130  mediastinal]
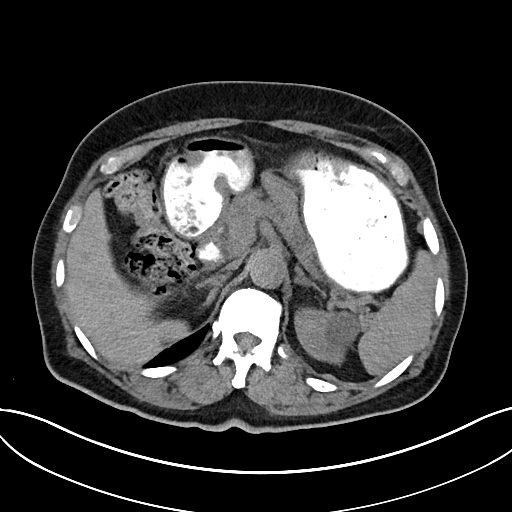
[im 71/130  lung]
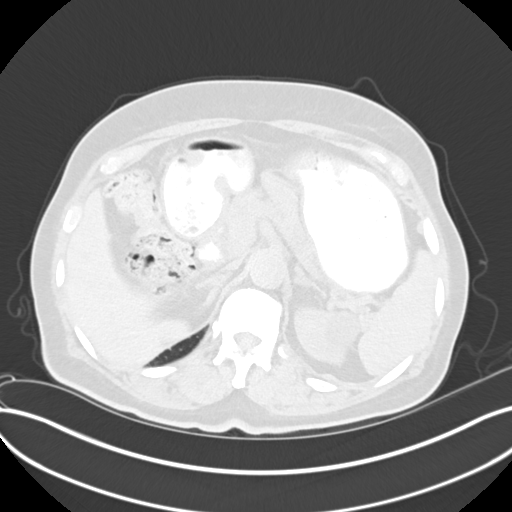
[im 83/130  lung]
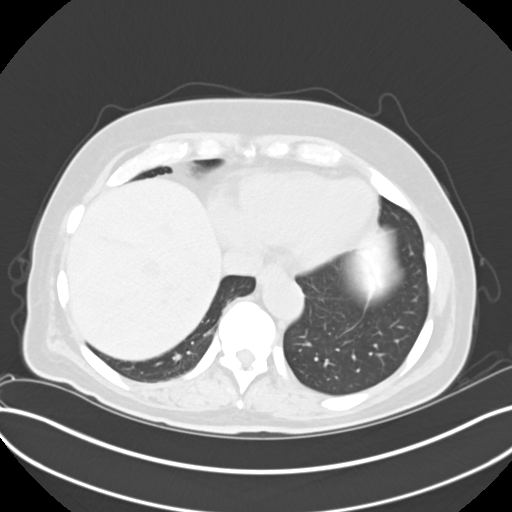
[im 94/130  lung]
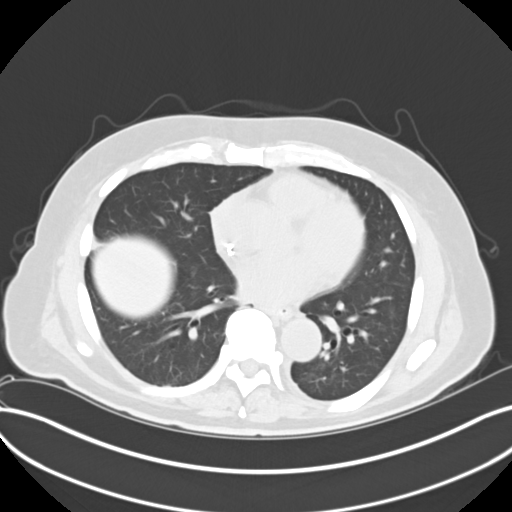
[im 106/130  lung]
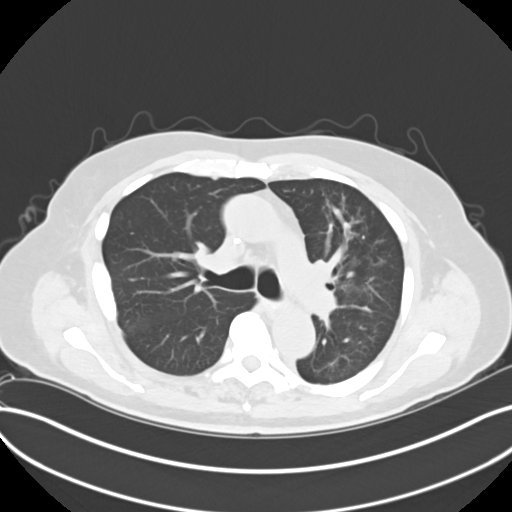
[im 118/130  mediastinal]
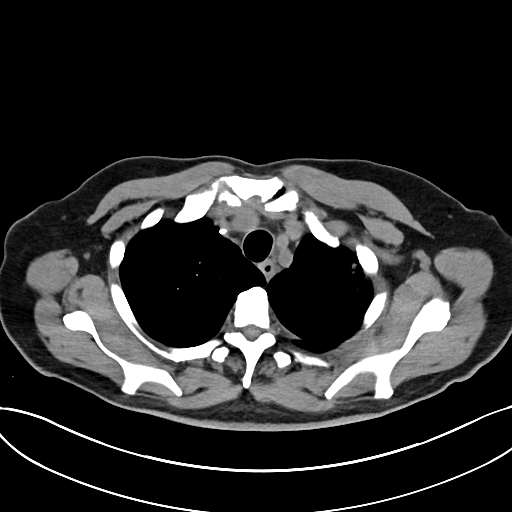
[im 118/130  lung]
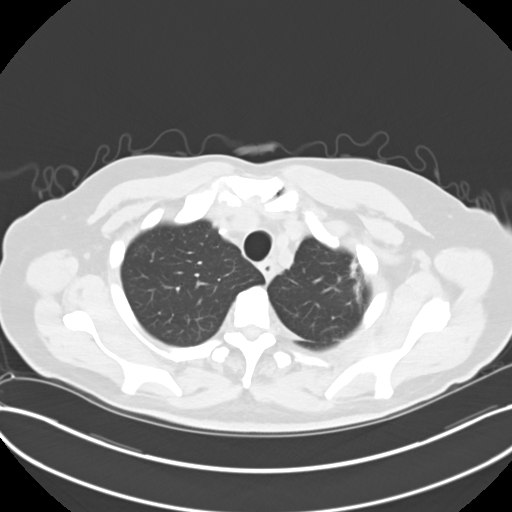

[Series 4: coronals cap 2.00 cor · coronal · 0.78mm/px · 3 of 141 slices shown]
[im 29/141  lung]
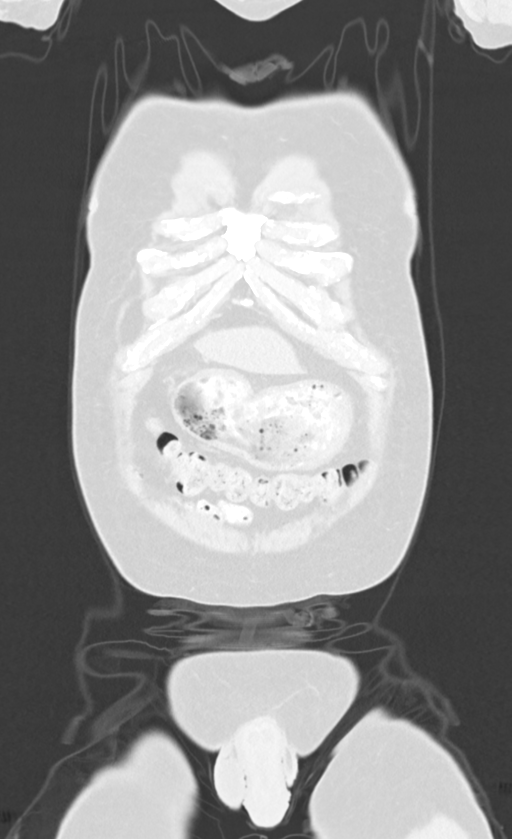
[im 57/141  lung]
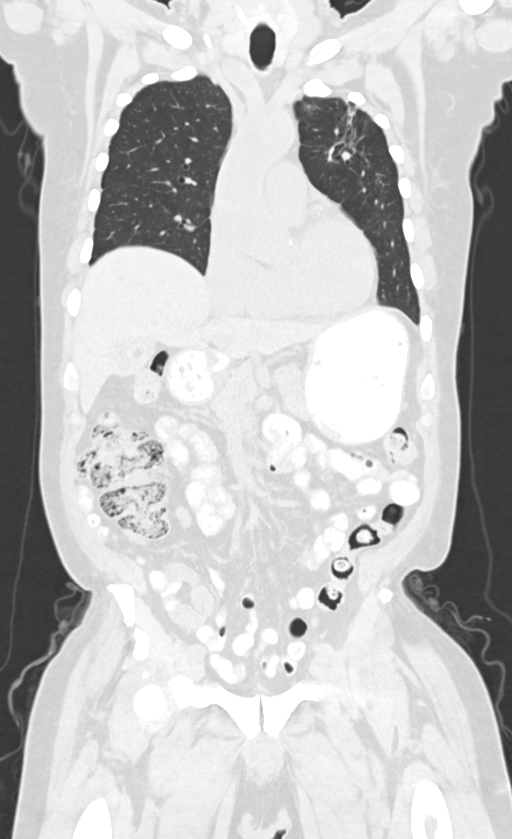
[im 85/141  lung]
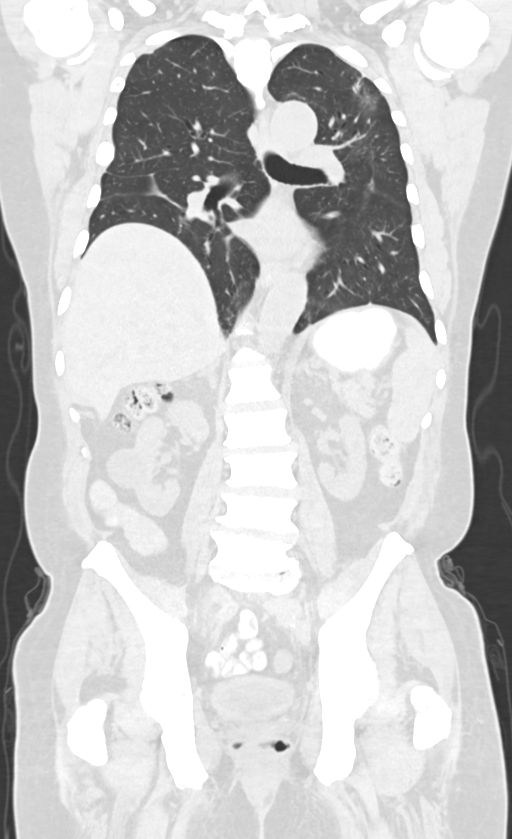

[12 of 36 positions shown; findings below may reference images not displayed]

FINDINGS: CT CHEST FINDINGS

Cardiovascular: Right Port-A-Cath tip: Right atrium.

Atherosclerotic calcification of the thoracic aorta and left
anterior descending coronary artery.

Mediastinum/Nodes: Unremarkable

Lungs/Pleura: Mild scarring or atelectasis along the right major
fissure with very faint ground-glass opacities in the right lung.
Patchy mostly central ground-glass densities in the left upper lobe
and left lower lobe are ill-defined; in the left upper lobe this
extends as a bandlike region of scarring or atelectasis to the apex.
The ground-glass opacities at the lung bases appear similar to the
08/03/2019 CT abdomen. Mild cylindrical bronchiectasis in both
lungs.

Musculoskeletal: Skeletal sclerosis diffuse compatible with diffuse
widespread osseous metastatic disease.

CT ABDOMEN PELVIS FINDINGS

Hepatobiliary: Gallstones are present in the gallbladder with
nitrogen gas phenomenon. Gallbladder wall thickening is present but
may simply be due to nondistention.

Pancreas: Unremarkable

Spleen: Unremarkable

Adrenals/Urinary Tract: Hypodense right kidney lower pole 4.1 by
cm lesion, internal density 13 Hounsfield units.

Hypodense exophytic left kidney upper pole 3.2 by 2.8 cm lesion,
internal density 12 Hounsfield units.

There is a 0.9 cm partially exophytic lobulation of the left kidney
upper pole posteriorly on image 62/2, density similar to the
adjacent renal parenchyma.

Mildly hypodense 0.9 cm lesion of the left kidney lower pole on
image 74/2, internal density 8 Hounsfield units.

There is scarring in the left kidney which contributes to a slightly
lobulated appearance.

Stomach/Bowel: Scattered sigmoid colon diverticula. Upper normal
amount of stool in the colon.

Vascular/Lymphatic: Aortoiliac atherosclerotic vascular disease. No
pathologic adenopathy identified.

Reproductive: Curvilinear likely dystrophic calcification along the
left side of the prostate gland, not changed from prior.

Other: No supplemental non-categorized findings.

Musculoskeletal: Diffuse skeletal sclerosis compatible with diffuse
osseous metastatic disease, not changed from prior.

Multilevel impingement in the lumbar spine due to spondylosis and
degenerative disc disease.
IMPRESSION: 1. Widespread/diffuse sclerotic osseous metastatic disease, not
appreciably changed from prior.
2. No adenopathy identified.
3. Patchy ground-glass opacities in the lungs, left greater than
right, with some confluent scarring in the left upper lobe. At the
lung bases, this process appears similar to the 08/03/2019 exam.
Possibilities might include hypersensitivity pneumonitis,
obstructive bronchiolitis, or nonspecific interstitial pneumonia.
4. Several renal hypodense lesions are likely cysts. Concern has
been raised about a complex lesion of the left kidney upper pole on
prior ultrasound; on today's exam the only corroborating candidate
lesion is the 0.9 cm partially exophytic lobulation of the left
kidney upper pole posteriorly. This could be a complex cyst or a
small mass; contrast enhancement is not interrogated on today's
noncontrast examination. Presuming the patient is unable to receive
IV contrast, surveillance is likely warranted.
5. Other imaging findings of potential clinical significance:
Coronary atherosclerosis. Cholelithiasis. Scattered sigmoid colon
diverticula. Multilevel impingement in the lumbar spine due to
spondylosis and degenerative disc disease.
